# Patient Record
Sex: Female | Born: 1985 | Race: White | Hispanic: No | Marital: Single | Smoking: Current every day smoker
Health system: Southern US, Community
[De-identification: ages and names within clinical notes are randomized; demographics above are authoritative.]

## PROBLEM LIST (undated history)

## (undated) ENCOUNTER — Inpatient Hospital Stay (HOSPITAL_COMMUNITY): Payer: Self-pay

## (undated) DIAGNOSIS — B999 Unspecified infectious disease: Secondary | ICD-10-CM

## (undated) DIAGNOSIS — K56609 Unspecified intestinal obstruction, unspecified as to partial versus complete obstruction: Secondary | ICD-10-CM

## (undated) DIAGNOSIS — B182 Chronic viral hepatitis C: Secondary | ICD-10-CM

## (undated) DIAGNOSIS — A599 Trichomoniasis, unspecified: Secondary | ICD-10-CM

## (undated) DIAGNOSIS — K599 Functional intestinal disorder, unspecified: Secondary | ICD-10-CM

## (undated) DIAGNOSIS — N390 Urinary tract infection, site not specified: Secondary | ICD-10-CM

## (undated) DIAGNOSIS — N83209 Unspecified ovarian cyst, unspecified side: Secondary | ICD-10-CM

## (undated) HISTORY — PX: NO PAST SURGERIES: SHX2092

## (undated) HISTORY — PX: WISDOM TOOTH EXTRACTION: SHX21

## (undated) HISTORY — DX: Chronic viral hepatitis C: B18.2

---

## 2016-04-14 NOTE — L&D Delivery Note (Signed)
Patient is 31 y.o. E3P2951G5P3103 4455w3d admitted for active labor.   Delivery Note At [phone] a viable girl was delivered via  SVD, Presentation: cephalic,LOA. APGAR: 9,9 ; weight pending.   Placenta status: spontaneous, intact. Cord: 3 vessel  Anesthesia:  epidural Episiotomy:  none Lacerations:  none Suture Repair: none Est. Blood Loss (mL): 250  Mom to postpartum.  Baby to Couplet care / Skin to Skin.  Rolm BookbinderAmber Margareta Laureano, DO [facility]

## 2016-12-09 ENCOUNTER — Ambulatory Visit (INDEPENDENT_AMBULATORY_CARE_PROVIDER_SITE_OTHER): Payer: Self-pay | Admitting: Certified Nurse Midwife

## 2016-12-09 ENCOUNTER — Encounter: Payer: Self-pay | Admitting: Family Medicine

## 2016-12-09 DIAGNOSIS — Z3201 Encounter for pregnancy test, result positive: Secondary | ICD-10-CM

## 2016-12-09 LAB — POCT PREGNANCY, URINE: Preg Test, Ur: POSITIVE — AB

## 2016-12-09 NOTE — Progress Notes (Signed)
Reviewed nurses note. Agree with plan. 

## 2016-12-09 NOTE — Progress Notes (Signed)
Patient presented to the office today for a pregnancy test. Test confirms she is pregnant around 22 weeks. Patient has been provided a verification letter of pregnancy to apply for assistance. Patient reports only taking prenatal vitamins at this time. She will follow up with her ob provider.

## 2016-12-23 ENCOUNTER — Encounter: Payer: Self-pay | Admitting: Obstetrics and Gynecology

## 2016-12-23 ENCOUNTER — Ambulatory Visit (INDEPENDENT_AMBULATORY_CARE_PROVIDER_SITE_OTHER): Payer: Self-pay | Admitting: Obstetrics and Gynecology

## 2016-12-23 VITALS — BP 98/54 | HR 69 | Ht 62.0 in | Wt 153.7 lb

## 2016-12-23 DIAGNOSIS — Z3480 Encounter for supervision of other normal pregnancy, unspecified trimester: Secondary | ICD-10-CM

## 2016-12-23 DIAGNOSIS — Z113 Encounter for screening for infections with a predominantly sexual mode of transmission: Secondary | ICD-10-CM

## 2016-12-23 DIAGNOSIS — O09212 Supervision of pregnancy with history of pre-term labor, second trimester: Secondary | ICD-10-CM

## 2016-12-23 DIAGNOSIS — Z3492 Encounter for supervision of normal pregnancy, unspecified, second trimester: Secondary | ICD-10-CM

## 2016-12-23 DIAGNOSIS — Z8751 Personal history of pre-term labor: Secondary | ICD-10-CM

## 2016-12-23 DIAGNOSIS — Z8759 Personal history of other complications of pregnancy, childbirth and the puerperium: Secondary | ICD-10-CM | POA: Insufficient documentation

## 2016-12-23 HISTORY — DX: Personal history of pre-term labor: Z87.51

## 2016-12-23 LAB — POCT URINALYSIS DIP (DEVICE)
BILIRUBIN URINE: NEGATIVE
GLUCOSE, UA: NEGATIVE mg/dL
Hgb urine dipstick: NEGATIVE
KETONES UR: NEGATIVE mg/dL
LEUKOCYTES UA: NEGATIVE
NITRITE: NEGATIVE
PH: 7 (ref 5.0–8.0)
PROTEIN: NEGATIVE mg/dL
SPECIFIC GRAVITY, URINE: 1.02 (ref 1.005–1.030)
UROBILINOGEN UA: 0.2 mg/dL (ref 0.0–1.0)

## 2016-12-23 MED ORDER — PRENATAL VITAMINS 28-0.8 MG PO TABS: 1.0000 | ORAL_TABLET | Freq: Every day | ORAL | 3 refills | Status: DC

## 2016-12-23 NOTE — Progress Notes (Signed)
Subjective:   Beth Cordova is a 31 y.o. G5P2013 at [redacted]w[redacted]d by LMP being seen today for her first obstetrical visit.  Her obstetrical history is significant for previous preterm delivery. Patient does intend to breast feed. Pregnancy history fully reviewed. Unplanned pregnancy.  FOB not involved at this time.  Previous fetal loss @ 20 weeks; spontaneous rupture of membranes; patient delivered at home in the bathtub. Prior pregnancies term with no complications. Currently [facility] at [facility].   Patient reports no complaints.  HISTORY: Obstetric History   G5   P3   T2   P0   A1   L3    SAB0   TAB0   Ectopic0   Multiple0   Live Births3     # Outcome Date GA Lbr Len/2nd Weight Sex Delivery Anes PTL Lv  5 Gravida           4 AB 04/09/15 [redacted]w[redacted]d  8 oz (0.227 kg) F   Y ND  3 Term 04/24/12 [redacted]w[redacted]d  5 lb 13 oz (2.637 kg) F Vag-Spont EPI  LIV  2 Para 04/01/05 [redacted]w[redacted]d  6 lb 12 oz (3.062 kg) F Vag-Spont EPI N LIV  1 Term 04/08/04 [redacted]w[redacted]d  6 lb 5 oz (2.863 kg) F Vag-Spont EPI N LIV     History reviewed. No pertinent past medical history. History reviewed. No pertinent surgical history. History reviewed. No pertinent family history. Social History  Substance Use Topics  . Smoking status: Never Smoker  . Smokeless tobacco: Never Used  . Alcohol use No   No Known Allergies No current outpatient prescriptions on file prior to visit.   No current facility-administered medications on file prior to visit.      Exam   Vitals:   12/23/16 1334 12/23/16 1343  BP: (!) 98/54   Pulse: 69   Weight: 153 lb 11.2 oz (69.7 kg)   Height:   (1.575 m)   Fetal Heart Rate (bpm): 148  Uterus:  Fundal Height: 24 cm  System: General: well-developed, well-nourished female in no acute distress   Breast:  normal appearance, no masses or tenderness   Skin: normal coloration and turgor, no rashes   Neurologic: oriented, normal, negative, normal mood   Extremities: normal strength, tone, and muscle  mass, ROM of all joints is normal   HEENT PERRLA, extraocular movement intact and sclera clear, anicteric   Mouth/Teeth mucous membranes moist, pharynx normal without lesions and dental hygiene good   Neck supple and no masses   Cardiovascular: regular rate and rhythm   Respiratory:  no respiratory distress, normal breath sounds   Abdomen: soft, non-tender; bowel sounds normal; no masses,  no organomegaly     Assessment:   Pregnancy: [id number] Patient Active Problem List   Diagnosis Date Noted  . Supervision of other normal pregnancy, antepartum 12/23/2016  . History of preterm delivery 12/23/2016     Plan:   1. Encounter for supervision of low-risk pregnancy in second trimester  - GC/Chlamydia probe amp ([place])not at [facility] - Cystic Fibrosis Mutation 97 - Culture, OB Urine - Obstetric Panel, Including HIV - Flu Vaccine QUAD 36+ mos IM - [place] MFM OB DETAIL +14 WK; Future  2. Supervision of low-risk pregnancy, second trimester   3. History of preterm delivery    Initial labs drawn. Continue prenatal vitamins. Genetic Screening discussed, Quad screen: requested, however patient is self pay.  Will hold off from ordering at this time. Medicaid is pending.  Ultrasound discussed; fetal anatomic survey: requested. Problem list reviewed and updated. The nature of [facility] with multiple MDs and other Advanced Practice Providers was explained to patient; also emphasized that residents, students are part of our team. Routine obstetric precautions reviewed. Discussed history with Dr. Debroah LoopArnold.    Melayna Robarts, Harolyn RutherfordJennifer I, NP [facility] for [facility], [facility]

## 2016-12-24 ENCOUNTER — Encounter: Payer: Self-pay | Admitting: Family Medicine

## 2016-12-24 LAB — OBSTETRIC PANEL, INCLUDING HIV
Antibody Screen: NEGATIVE
BASOS: 0 %
Basophils Absolute: 0 10*3/uL (ref 0.0–0.2)
EOS (ABSOLUTE): 0.1 10*3/uL (ref 0.0–0.4)
EOS: 2 %
HEMATOCRIT: 33.1 % — AB (ref 34.0–46.6)
HEMOGLOBIN: 10.9 g/dL — AB (ref 11.1–15.9)
HEP B S AG: NEGATIVE
HIV Screen 4th Generation wRfx: NONREACTIVE
IMMATURE GRANS (ABS): 0 10*3/uL (ref 0.0–0.1)
IMMATURE GRANULOCYTES: 0 %
LYMPHS: 34 %
Lymphocytes Absolute: 2.4 10*3/uL (ref 0.7–3.1)
MCH: 31.7 pg (ref 26.6–33.0)
MCHC: 32.9 g/dL (ref 31.5–35.7)
MCV: 96 fL (ref 79–97)
Monocytes Absolute: 0.3 10*3/uL (ref 0.1–0.9)
Monocytes: 5 %
Neutrophils Absolute: 4.3 10*3/uL (ref 1.4–7.0)
Neutrophils: 59 %
Platelets: 232 10*3/uL (ref 150–379)
RBC: 3.44 x10E6/uL — ABNORMAL LOW (ref 3.77–5.28)
RDW: 13.1 % (ref 12.3–15.4)
RH TYPE: POSITIVE
RPR: NONREACTIVE
Rubella Antibodies, IGG: 3.06 index (ref >0.99)
WBC: 7.2 10*3/uL (ref 3.4–10.8)

## 2016-12-25 ENCOUNTER — Encounter: Payer: Self-pay | Admitting: Family Medicine

## 2016-12-25 LAB — URINE CULTURE, OB REFLEX

## 2016-12-25 LAB — CULTURE, OB URINE

## 2016-12-26 ENCOUNTER — Encounter (HOSPITAL_COMMUNITY): Payer: Self-pay

## 2016-12-29 LAB — CYSTIC FIBROSIS MUTATION 97: GENE DIS ANAL CARRIER INTERP BLD/T-IMP: NOT DETECTED

## 2016-12-30 ENCOUNTER — Encounter (HOSPITAL_COMMUNITY): Payer: Self-pay

## 2016-12-30 ENCOUNTER — Ambulatory Visit (HOSPITAL_COMMUNITY)
Admission: RE | Admit: 2016-12-30 | Discharge: 2016-12-30 | Disposition: A | Discharge status: Home / Self Care | Payer: Medicaid Other | Source: Ambulatory Visit | Attending: Obstetrics and Gynecology | Admitting: Obstetrics and Gynecology

## 2016-12-30 DIAGNOSIS — Z363 Encounter for antenatal screening for malformations: Secondary | ICD-10-CM | POA: Diagnosis present

## 2016-12-30 DIAGNOSIS — O09292 Supervision of pregnancy with other poor reproductive or obstetric history, second trimester: Secondary | ICD-10-CM | POA: Diagnosis present

## 2016-12-30 DIAGNOSIS — Z3A25 25 weeks gestation of pregnancy: Secondary | ICD-10-CM | POA: Diagnosis not present

## 2016-12-30 DIAGNOSIS — Z3492 Encounter for supervision of normal pregnancy, unspecified, second trimester: Secondary | ICD-10-CM

## 2016-12-30 DIAGNOSIS — O0932 Supervision of pregnancy with insufficient antenatal care, second trimester: Secondary | ICD-10-CM | POA: Insufficient documentation

## 2016-12-30 DIAGNOSIS — O321XX0 Maternal care for breech presentation, not applicable or unspecified: Secondary | ICD-10-CM | POA: Diagnosis not present

## 2016-12-30 HISTORY — DX: Trichomoniasis, unspecified: A59.9

## 2016-12-30 HISTORY — DX: Functional intestinal disorder, unspecified: K59.9

## 2016-12-30 NOTE — Progress Notes (Signed)
Patient was not in lobby when called. RN was informed by registration that patient went into U/S with another patient that is here.

## 2016-12-31 ENCOUNTER — Other Ambulatory Visit (HOSPITAL_COMMUNITY): Payer: Self-pay | Admitting: *Deleted

## 2016-12-31 DIAGNOSIS — IMO0002 Reserved for concepts with insufficient information to code with codable children: Secondary | ICD-10-CM

## 2016-12-31 DIAGNOSIS — Z0489 Encounter for examination and observation for other specified reasons: Secondary | ICD-10-CM

## 2017-01-20 ENCOUNTER — Ambulatory Visit (INDEPENDENT_AMBULATORY_CARE_PROVIDER_SITE_OTHER): Payer: Self-pay | Admitting: Family Medicine

## 2017-01-20 VITALS — BP 133/76 | HR 80 | Wt 160.1 lb

## 2017-01-20 DIAGNOSIS — Z3483 Encounter for supervision of other normal pregnancy, third trimester: Secondary | ICD-10-CM

## 2017-01-20 DIAGNOSIS — Z23 Encounter for immunization: Secondary | ICD-10-CM

## 2017-01-20 DIAGNOSIS — Z3480 Encounter for supervision of other normal pregnancy, unspecified trimester: Secondary | ICD-10-CM

## 2017-01-20 NOTE — Patient Instructions (Signed)

## 2017-01-20 NOTE — Progress Notes (Signed)
   PRENATAL VISIT NOTE  Subjective:  Beth Cordova is a 31 y.o. G5P2013 at [redacted]w[redacted]d being seen today for ongoing prenatal care.  She is currently monitored for the following issues for this low-risk pregnancy and has Supervision of other normal pregnancy, antepartum and History of preterm delivery on her problem list.  Patient reports no complaints.  Contractions: Not present. Vag. Bleeding: None.  Movement: Present. Denies leaking of fluid.   The following portions of the patient's history were reviewed and updated as appropriate: allergies, current medications, past family history, past medical history, past social history, past surgical history and problem list. Problem list updated.  Objective:   Vitals:   01/20/17 0843  BP: 133/76  Pulse: 80  Weight: 160 lb 1.6 oz (72.6 kg)    Fetal Status: Fetal Heart Rate (bpm): 130   Movement: Present   Fundal height 29 cm  General:  Alert, oriented and cooperative. Patient is in no acute distress.  Skin: Skin is warm and dry. No rash noted.   Cardiovascular: Normal heart rate noted  Respiratory: Normal respiratory effort, no problems with respiration noted  Abdomen: Soft, gravid, appropriate for gestational age.  Pain/Pressure: Absent     Pelvic: Cervical exam deferred        Extremities: Normal range of motion.  Edema: None  Mental Status:  Normal mood and affect. Normal behavior. Normal judgment and thought content.   Assessment and Plan:  Pregnancy: G5P2013 at [redacted]w[redacted]d  1. Encounter for supervision of other normal pregnancy in third trimester - Glucose Tolerance, 2 Hours w/1 Hour - CBC - HIV antibody (with reflex) - RPR - Tdap vaccine greater than or equal to 7yo IM -declines flu - needs interval growth [place] at 31 weeks  2. Need for diphtheria-tetanus-pertussis (Tdap) vaccine - Tdap vaccine greater than or equal to 7yo IM   Preterm labor symptoms and general obstetric precautions including but not limited to vaginal bleeding,  contractions, leaking of fluid and fetal movement were reviewed in detail with the patient. Please refer to After Visit Summary for other counseling recommendations.  Return in about 2 weeks (around 02/03/2017).   Rolm Bookbinder, DO

## 2017-01-20 NOTE — Progress Notes (Signed)
28 week labs/tdap today 

## 2017-01-21 LAB — CBC
HEMATOCRIT: 33.3 % — AB (ref 34.0–46.6)
HEMOGLOBIN: 11.1 g/dL (ref 11.1–15.9)
MCH: 32 pg (ref 26.6–33.0)
MCHC: 33.3 g/dL (ref 31.5–35.7)
MCV: 96 fL (ref 79–97)
Platelets: 221 10*3/uL (ref 150–379)
RBC: 3.47 x10E6/uL — ABNORMAL LOW (ref 3.77–5.28)
RDW: 13.2 % (ref 12.3–15.4)
WBC: 7.1 10*3/uL (ref 3.4–10.8)

## 2017-01-21 LAB — HIV ANTIBODY (ROUTINE TESTING W REFLEX): HIV SCREEN 4TH GENERATION: NONREACTIVE

## 2017-01-21 LAB — RPR: RPR Ser Ql: NONREACTIVE

## 2017-01-21 LAB — GLUCOSE TOLERANCE, 2 HOURS W/ 1HR
GLUCOSE, 1 HOUR: 112 mg/dL (ref 65–179)
Glucose, 2 hour: 54 mg/dL — ABNORMAL LOW (ref 65–152)
Glucose, Fasting: 78 mg/dL (ref 65–91)

## 2017-01-23 ENCOUNTER — Encounter (HOSPITAL_COMMUNITY): Payer: Self-pay

## 2017-01-27 ENCOUNTER — Ambulatory Visit (HOSPITAL_COMMUNITY)
Admission: RE | Admit: 2017-01-27 | Discharge: 2017-01-27 | Disposition: A | Discharge status: Home / Self Care | Payer: Medicaid Other | Source: Ambulatory Visit | Attending: Obstetrics and Gynecology | Admitting: Obstetrics and Gynecology

## 2017-01-27 ENCOUNTER — Other Ambulatory Visit (HOSPITAL_COMMUNITY): Payer: Self-pay | Admitting: Obstetrics and Gynecology

## 2017-01-27 ENCOUNTER — Encounter (HOSPITAL_COMMUNITY): Payer: Self-pay

## 2017-01-27 DIAGNOSIS — Z3A29 29 weeks gestation of pregnancy: Secondary | ICD-10-CM

## 2017-01-27 DIAGNOSIS — IMO0002 Reserved for concepts with insufficient information to code with codable children: Secondary | ICD-10-CM

## 2017-01-27 DIAGNOSIS — Z0489 Encounter for examination and observation for other specified reasons: Secondary | ICD-10-CM

## 2017-01-27 DIAGNOSIS — O09212 Supervision of pregnancy with history of pre-term labor, second trimester: Secondary | ICD-10-CM | POA: Diagnosis not present

## 2017-01-27 DIAGNOSIS — Z362 Encounter for other antenatal screening follow-up: Secondary | ICD-10-CM | POA: Diagnosis not present

## 2017-01-28 ENCOUNTER — Other Ambulatory Visit (HOSPITAL_COMMUNITY): Payer: Self-pay | Admitting: *Deleted

## 2017-01-28 DIAGNOSIS — Z0489 Encounter for examination and observation for other specified reasons: Secondary | ICD-10-CM

## 2017-01-28 DIAGNOSIS — IMO0002 Reserved for concepts with insufficient information to code with codable children: Secondary | ICD-10-CM

## 2017-02-03 ENCOUNTER — Ambulatory Visit (INDEPENDENT_AMBULATORY_CARE_PROVIDER_SITE_OTHER): Payer: Medicaid Other | Admitting: Medical

## 2017-02-03 VITALS — BP 114/66 | HR 78 | Wt 162.4 lb

## 2017-02-03 DIAGNOSIS — Z8751 Personal history of pre-term labor: Secondary | ICD-10-CM

## 2017-02-03 DIAGNOSIS — O09213 Supervision of pregnancy with history of pre-term labor, third trimester: Secondary | ICD-10-CM

## 2017-02-03 DIAGNOSIS — Z3480 Encounter for supervision of other normal pregnancy, unspecified trimester: Secondary | ICD-10-CM

## 2017-02-03 DIAGNOSIS — Z3483 Encounter for supervision of other normal pregnancy, third trimester: Secondary | ICD-10-CM

## 2017-02-03 NOTE — Progress Notes (Signed)
   PRENATAL VISIT NOTE  Subjective:  Beth Cordova is a 31 y.o. G5P2013 at 3916w1d being seen today for ongoing prenatal care.  She is currently monitored for the following issues for this high-risk pregnancy and has Supervision of other normal pregnancy, antepartum and History of preterm delivery on her problem list.  Patient reports no complaints.  Contractions: Not present. Vag. Bleeding: None.  Movement: Present. Denies leaking of fluid.   The following portions of the patient's history were reviewed and updated as appropriate: allergies, current medications, past family history, past medical history, past social history, past surgical history and problem list. Problem list updated.  Objective:   Vitals:   02/03/17 0940  BP: 114/66  Pulse: 78  Weight: 162 lb 6.4 oz (73.7 kg)    Fetal Status: Fetal Heart Rate (bpm): 145 Fundal Height: 30 cm Movement: Present     General:  Alert, oriented and cooperative. Patient is in no acute distress.  Skin: Skin is warm and dry. No rash noted.   Cardiovascular: Normal heart rate noted  Respiratory: Normal respiratory effort, no problems with respiration noted  Abdomen: Soft, gravid, appropriate for gestational age.  Pain/Pressure: Present     Pelvic: Cervical exam deferred        Extremities: Normal range of motion.  Edema: Trace  Mental Status:  Normal mood and affect. Normal behavior. Normal judgment and thought content.   Assessment and Plan:  Pregnancy: G5P2013 at 2016w1d  1. Supervision of other normal pregnancy, antepartum - Doing well - Follow-up anatomy 02/24/17  2. History of preterm delivery - Preterm labor precautions discussed - No recent S/S of PTL  Preterm labor symptoms and general obstetric precautions including but not limited to vaginal bleeding, contractions, leaking of fluid and fetal movement were reviewed in detail with the patient. Please refer to After Visit Summary for other counseling recommendations.  Return  [facility] about 2 weeks (around 02/17/2017[facility]   Vonzella NippleJulie Wenzel, PA-C

## 2017-02-03 NOTE — Patient Instructions (Signed)
Fetal Movement Counts °Patient Name: ________________________________________________ Patient Due Date: ____________________ °What is a fetal movement count? °A fetal movement count is the number of times that you feel your baby move during a certain amount of time. This may also be called a fetal kick count. A fetal movement count is recommended for every pregnant woman. You may be asked to start counting fetal movements as early as week 28 of your pregnancy. °Pay attention to when your baby is most active. You may notice your baby's sleep and wake cycles. You may also notice things that make your baby move more. You should do a fetal movement count: °· When your baby is normally most active. °· At the same time each day. ° °A good time to count movements is while you are resting, after having something to eat and drink. °How do I count fetal movements? °1. Find a quiet, comfortable area. Sit, or lie down on your side. °2. Write down the date, the start time and stop time, and the number of movements that you felt between those two times. Take this information with you to your health care visits. °3. For 2 hours, count kicks, flutters, swishes, rolls, and jabs. You should feel at least 10 movements during 2 hours. °4. You may stop counting after you have felt 10 movements. °5. If you do not feel 10 movements in 2 hours, have something to eat and drink. Then, keep resting and counting for 1 hour. If you feel at least 4 movements during that hour, you may stop counting. °Contact a health care provider if: °· You feel fewer than 4 movements in 2 hours. °· Your baby is not moving like he or she usually does. °Date: ____________ Start time: ____________ Stop time: ____________ Movements: ____________ °Date: ____________ Start time: ____________ Stop time: ____________ Movements: ____________ °Date: ____________ Start time: ____________ Stop time: ____________ Movements: ____________ °Date: ____________ Start time:  ____________ Stop time: ____________ Movements: ____________ °Date: ____________ Start time: ____________ Stop time: ____________ Movements: ____________ °Date: ____________ Start time: ____________ Stop time: ____________ Movements: ____________ °Date: ____________ Start time: ____________ Stop time: ____________ Movements: ____________ °Date: ____________ Start time: ____________ Stop time: ____________ Movements: ____________ °Date: ____________ Start time: ____________ Stop time: ____________ Movements: ____________ °This information is not intended to replace advice given to you by your health care provider. Make sure you discuss any questions you have with your health care provider. °Document Released: 04/30/2006 Document Revised: 11/28/2015 Document Reviewed: 05/10/2015 °Elsevier Interactive Patient Education © 2018 Elsevier Inc[employer] °Braxton Hicks Contractions °Contractions of the uterus can occur throughout pregnancy, but they are not always a sign that you are in labor. You may have practice contractions called Braxton Hicks contractions. These false labor contractions are sometimes confused with true labor. °What are Braxton Hicks contractions? °Braxton Hicks contractions are tightening movements that occur in the muscles of the uterus before labor. Unlike true labor contractions, these contractions do not result in opening (dilation) and thinning of the cervix. Toward the end of pregnancy (32-34 weeks), Braxton Hicks contractions can happen more often and may become stronger. These contractions are sometimes difficult to tell apart from true labor because they can be very uncomfortable. You should not feel embarrassed if you go to the hospital with false labor. °Sometimes, the only way to tell if you are in true labor is for your health care provider to look for changes in the cervix. The health care provider will do a physical exam and may monitor your contractions. If   you are not in true labor, the exam  should show that your cervix is not dilating and your water has not broken. °If there are no prenatal problems or other health problems associated with your pregnancy, it is completely safe for you to be sent home with false labor. You may continue to have Braxton Hicks contractions until you go into true labor. °How can I tell the difference between true labor and false labor? °· Differences °? False labor °? Contractions last 30-70 seconds.: Contractions are usually shorter and not as strong as true labor contractions. °? Contractions become very regular.: Contractions are usually irregular. °? Discomfort is usually felt in the top of the uterus, and it spreads to the lower abdomen and low back.: Contractions are often felt in the front of the lower abdomen and in the groin. °? Contractions do not go away with walking.: Contractions may go away when you walk around or change positions while lying down. °? Contractions usually become more intense and increase in frequency.: Contractions get weaker and are shorter-lasting as time goes on. °? The cervix dilates and gets thinner.: The cervix usually does not dilate or become thin. °Follow these instructions at home: °· Take over-the-counter and prescription medicines only as told by your health care provider. °· Keep up with your usual exercises and follow other instructions from your health care provider. °· Eat and drink lightly if you think you are going into labor. °· If Braxton Hicks contractions are making you uncomfortable: °? Change your position from lying down or resting to walking, or change from walking to resting. °? Sit and rest in a tub of warm water. °? Drink enough fluid to keep your urine clear or pale yellow. Dehydration may cause these contractions. °? Do slow and deep breathing several times an hour. °· Keep all follow-up prenatal visits as told by your health care provider. This is important. °Contact a health care provider if: °· You have a  fever. °· You have continuous pain in your abdomen. °Get help right away if: °· Your contractions become stronger, more regular, and closer together. °· You have fluid leaking or gushing from your vagina. °· You pass blood-tinged mucus (bloody show). °· You have bleeding from your vagina. °· You have low back pain that you never had before. °· You feel your baby’s head pushing down and causing pelvic pressure. °· Your baby is not moving inside you as much as it used to. °Summary °· Contractions that occur before labor are called Braxton Hicks contractions, false labor, or practice contractions. °· Braxton Hicks contractions are usually shorter, weaker, farther apart, and less regular than true labor contractions. True labor contractions usually become progressively stronger and regular and they become more frequent. °· Manage discomfort from Braxton Hicks contractions by changing position, resting in a warm bath, drinking plenty of water, or practicing deep breathing. °This information is not intended to replace advice given to you by your health care provider. Make sure you discuss any questions you have with your health care provider. °Document Released: 03/31/2005 Document Revised: 02/18/2016 Document Reviewed: 02/18/2016 °Elsevier Interactive Patient Education © 2017 Elsevier Inc. ° °

## 2017-02-16 ENCOUNTER — Inpatient Hospital Stay (HOSPITAL_COMMUNITY)
Admission: AD | Admit: 2017-02-16 | Discharge: 2017-02-16 | Disposition: A | Discharge status: Home / Self Care | Payer: Medicaid Other | Source: Ambulatory Visit | Attending: Obstetrics & Gynecology | Admitting: Obstetrics & Gynecology

## 2017-02-16 ENCOUNTER — Encounter (HOSPITAL_COMMUNITY): Payer: Self-pay | Admitting: *Deleted

## 2017-02-16 DIAGNOSIS — S00522A Blister (nonthermal) of oral cavity, initial encounter: Secondary | ICD-10-CM

## 2017-02-16 DIAGNOSIS — K047 Periapical abscess without sinus: Secondary | ICD-10-CM | POA: Insufficient documentation

## 2017-02-16 DIAGNOSIS — Z87891 Personal history of nicotine dependence: Secondary | ICD-10-CM | POA: Diagnosis not present

## 2017-02-16 DIAGNOSIS — O9989 Other specified diseases and conditions complicating pregnancy, childbirth and the puerperium: Secondary | ICD-10-CM

## 2017-02-16 DIAGNOSIS — O26893 Other specified pregnancy related conditions, third trimester: Secondary | ICD-10-CM | POA: Diagnosis present

## 2017-02-16 DIAGNOSIS — Z3A32 32 weeks gestation of pregnancy: Secondary | ICD-10-CM | POA: Diagnosis not present

## 2017-02-16 DIAGNOSIS — K0510 Chronic gingivitis, plaque induced: Secondary | ICD-10-CM | POA: Diagnosis not present

## 2017-02-16 HISTORY — DX: Unspecified intestinal obstruction, unspecified as to partial versus complete obstruction: K56.609

## 2017-02-16 LAB — URINALYSIS, ROUTINE W REFLEX MICROSCOPIC
BILIRUBIN URINE: NEGATIVE
GLUCOSE, UA: NEGATIVE mg/dL
HGB URINE DIPSTICK: NEGATIVE
KETONES UR: NEGATIVE mg/dL
NITRITE: NEGATIVE
PH: 5 (ref 5.0–8.0)
Protein, ur: 30 mg/dL — AB
Specific Gravity, Urine: 1.023 (ref 1.005–1.030)

## 2017-02-16 MED ORDER — AMOXICILLIN 500 MG PO CAPS: 500.0000 mg | ORAL_CAPSULE | Freq: Two times a day (BID) | ORAL | 0 refills | Status: AC

## 2017-02-16 NOTE — MAU Note (Signed)
Pt presents to MAU with complaints of gum soreness for 3 days. Was told to come here by her dentist since she is pregnant

## 2017-02-16 NOTE — MAU Provider Note (Signed)
  History     CSN: [id number]  Arrival date and time: 02/16/17 0900   None     Chief Complaint  Patient presents with  . gum pain   31 yo G5P2013 at 4636w0d here for right upper gum pain and swelling x 3-4 days. Denies fever, sweats, chills. Admits to associated right facial pain. Called her dentist but they said they would not see her while pregnant.    OB History    Gravida Para Term Preterm AB Living   5 3 2   1 3    SAB TAB Ectopic Multiple Live Births   0       3      Past Medical History:  Diagnosis Date  . Bowel dysfunction    States she has to eat 5 small meals a day  . Bowel obstruction (HCC)   . Trichomonas infection     Past Surgical History:  Procedure Laterality Date  . WISDOM TOOTH EXTRACTION      No family history on file.  Social History   Tobacco Use  . Smoking status: Former Games developermoker  . Smokeless tobacco: Never Used  Substance Use Topics  . Alcohol use: No  . Drug use: No    Allergies: No Known Allergies  Medications Prior to Admission  Medication Sig Dispense Refill Last Dose  . Prenatal Vit-Fe Fumarate-FA (PRENATAL VITAMINS) 28-0.8 MG TABS Take 1 tablet by mouth daily. 60 tablet 3 Taking    Review of Systems  Constitutional: Negative for fatigue and fever.  HENT: Positive for dental problem. Negative for congestion.   Eyes: Negative for discharge and itching.  Respiratory: Negative for apnea and chest tightness.   Cardiovascular: Negative for chest pain and leg swelling.  Gastrointestinal: Negative for abdominal distention and abdominal pain.  Endocrine: Negative for cold intolerance and heat intolerance.  Genitourinary: Negative for difficulty urinating and dyspareunia.  Musculoskeletal: Negative for arthralgias and back pain.  Psychiatric/Behavioral: Negative for agitation and behavioral problems.   Physical Exam   Blood pressure 116/78, pulse 94, temperature (!) 97.4 F (36.3 C), resp. rate 16, weight 165 lb (74.8 kg), last  menstrual period 07/07/2016, unknown if currently breastfeeding.  Physical Exam  Constitutional: She is oriented to person, place, and time. She appears well-developed and well-nourished.  HENT:  Head: Normocephalic and atraumatic.  Right upper gum with 0.5 cm swelling and white exudate, tender to palpation. Right maxilla tender to palpation. No lymphadenopathy  Eyes: Conjunctivae are normal. Pupils are equal, round, and reactive to light.  Cardiovascular: Normal rate and intact distal pulses.  Respiratory: Effort normal. No respiratory distress.  Musculoskeletal: Normal range of motion. She exhibits no edema.  Neurological: She is alert and oriented to person, place, and time.  Skin: Skin is warm and dry.  Psychiatric: She has a normal mood and affect. Her behavior is normal.    MAU Course  Procedures   Assessment and Plan  1. Pregnancy at [redacted] weeks gestation- follow up outpatient 2. Gum/tooth infection- treat with amoxicillan. Follow up outpatient with different dentist. Tylenol for pain  Braycen Burandt 02/16/2017, 12:32 PM

## 2017-02-18 ENCOUNTER — Ambulatory Visit (INDEPENDENT_AMBULATORY_CARE_PROVIDER_SITE_OTHER): Payer: Medicaid Other | Admitting: Obstetrics & Gynecology

## 2017-02-18 ENCOUNTER — Encounter: Payer: Self-pay | Admitting: Family Medicine

## 2017-02-18 VITALS — BP 105/66 | HR 67 | Wt 168.2 lb

## 2017-02-18 DIAGNOSIS — Z3480 Encounter for supervision of other normal pregnancy, unspecified trimester: Secondary | ICD-10-CM

## 2017-02-18 DIAGNOSIS — O09213 Supervision of pregnancy with history of pre-term labor, third trimester: Secondary | ICD-10-CM

## 2017-02-18 DIAGNOSIS — Z8751 Personal history of pre-term labor: Secondary | ICD-10-CM

## 2017-02-18 NOTE — Progress Notes (Signed)
   PRENATAL VISIT NOTE  Subjective:  Beth ShuttersSavanah Cordova is a 31 y.o. Z6X0960G5P3103 at 8253w2d being seen today for ongoing prenatal care.  She is currently monitored for the following issues for this high-risk pregnancy and has Supervision of other normal pregnancy, antepartum and History of preterm delivery on their problem list.  Patient reports no complaints.  Contractions: Not present. Vag. Bleeding: None.  Movement: Present. Denies leaking of fluid.   The following portions of the patient's history were reviewed and updated as appropriate: allergies, current medications, past family history, past medical history, past social history, past surgical history and problem list. Problem list updated.  Objective:   Vitals:   02/18/17 1508  BP: 105/66  Pulse: 67  Weight: 168 lb 3.2 oz (76.3 kg)    Fetal Status: Fetal Heart Rate (bpm): 132 Fundal Height: 30 cm Movement: Present     General:  Alert, oriented and cooperative. Patient is in no acute distress.  Skin: Skin is warm and dry. No rash noted.   Cardiovascular: Normal heart rate noted  Respiratory: Normal respiratory effort, no problems with respiration noted  Abdomen: Soft, gravid, appropriate for gestational age.  Pain/Pressure: Absent     Pelvic: Cervical exam deferred        Extremities: Normal range of motion.  Edema: None  Mental Status:  Normal mood and affect. Normal behavior. Normal judgment and thought content.   Assessment and Plan:  Pregnancy: A5W0981G5P3103 at 7153w2d  1. History of preterm delivery 2. Supervision of other normal pregnancy, antepartum Preterm labor symptoms and general obstetric precautions including but not limited to vaginal bleeding, contractions, leaking of fluid and fetal movement were reviewed in detail with the patient. Please refer to After Visit Summary for other counseling recommendations.  Return in about 2 weeks (around 03/04/2017) for OB Visit (LOB).   Jaynie CollinsUgonna Ingris Pasquarella, MD

## 2017-02-18 NOTE — Patient Instructions (Signed)
Return to clinic for any scheduled appointments or obstetric concerns, or go to MAU for evaluation  

## 2017-02-23 ENCOUNTER — Encounter (HOSPITAL_COMMUNITY): Payer: Self-pay

## 2017-02-24 ENCOUNTER — Encounter (HOSPITAL_COMMUNITY): Payer: Self-pay

## 2017-02-24 ENCOUNTER — Ambulatory Visit (HOSPITAL_COMMUNITY)
Admission: RE | Admit: 2017-02-24 | Discharge: 2017-02-24 | Disposition: A | Discharge status: Home / Self Care | Payer: Medicaid Other | Source: Ambulatory Visit | Attending: Obstetrics and Gynecology | Admitting: Obstetrics and Gynecology

## 2017-02-24 ENCOUNTER — Other Ambulatory Visit (HOSPITAL_COMMUNITY): Payer: Self-pay | Admitting: Obstetrics and Gynecology

## 2017-02-24 DIAGNOSIS — Z3A33 33 weeks gestation of pregnancy: Secondary | ICD-10-CM

## 2017-02-24 DIAGNOSIS — Z362 Encounter for other antenatal screening follow-up: Secondary | ICD-10-CM | POA: Insufficient documentation

## 2017-02-24 DIAGNOSIS — Z0489 Encounter for examination and observation for other specified reasons: Secondary | ICD-10-CM

## 2017-02-24 DIAGNOSIS — O09299 Supervision of pregnancy with other poor reproductive or obstetric history, unspecified trimester: Secondary | ICD-10-CM

## 2017-02-24 DIAGNOSIS — IMO0002 Reserved for concepts with insufficient information to code with codable children: Secondary | ICD-10-CM

## 2017-03-09 ENCOUNTER — Ambulatory Visit (INDEPENDENT_AMBULATORY_CARE_PROVIDER_SITE_OTHER): Payer: Medicaid Other | Admitting: Family Medicine

## 2017-03-09 ENCOUNTER — Other Ambulatory Visit (HOSPITAL_COMMUNITY)
Admission: RE | Admit: 2017-03-09 | Discharge: 2017-03-09 | Disposition: A | Discharge status: Home / Self Care | Payer: Medicaid Other | Source: Ambulatory Visit | Attending: Family Medicine | Admitting: Family Medicine

## 2017-03-09 VITALS — BP 114/70 | HR 84 | Wt 172.3 lb

## 2017-03-09 DIAGNOSIS — Z8751 Personal history of pre-term labor: Secondary | ICD-10-CM

## 2017-03-09 DIAGNOSIS — O98819 Other maternal infectious and parasitic diseases complicating pregnancy, unspecified trimester: Secondary | ICD-10-CM | POA: Diagnosis not present

## 2017-03-09 DIAGNOSIS — N760 Acute vaginitis: Secondary | ICD-10-CM | POA: Diagnosis not present

## 2017-03-09 DIAGNOSIS — A5909 Other urogenital trichomoniasis: Secondary | ICD-10-CM | POA: Diagnosis not present

## 2017-03-09 DIAGNOSIS — Z3A00 Weeks of gestation of pregnancy not specified: Secondary | ICD-10-CM | POA: Insufficient documentation

## 2017-03-09 DIAGNOSIS — Z3483 Encounter for supervision of other normal pregnancy, third trimester: Secondary | ICD-10-CM

## 2017-03-09 DIAGNOSIS — Z3480 Encounter for supervision of other normal pregnancy, unspecified trimester: Secondary | ICD-10-CM | POA: Diagnosis present

## 2017-03-09 DIAGNOSIS — B9689 Other specified bacterial agents as the cause of diseases classified elsewhere: Secondary | ICD-10-CM | POA: Insufficient documentation

## 2017-03-09 LAB — OB RESULTS CONSOLE GBS: STREP GROUP B AG: POSITIVE

## 2017-03-09 NOTE — Progress Notes (Signed)
   PRENATAL VISIT NOTE  Subjective:  Beth Cordova is a 31 y.o. [id number] at 362w0d being seen today for ongoing prenatal care.  She is currently monitored for the following issues for this low-risk pregnancy and has Supervision of other normal pregnancy, antepartum and History of preterm delivery on their problem list.  Patient reports contractions every 10 min on/off today..  Contractions: Irregular. Vag. Bleeding: None.  Movement: Present. Denies leaking of fluid.   The following portions of the patient's history were reviewed and updated as appropriate: allergies, current medications, past family history, past medical history, past social history, past surgical history and problem list. Problem list updated.  Objective:   Vitals:   03/09/17 1520  BP: 114/70  Pulse: 84  Weight: 172 lb 4.8 oz (78.2 kg)    Fetal Status: Fetal Heart Rate (bpm): 136 Fundal Height: 35 cm Movement: Present     General:  Alert, oriented and cooperative. Patient is in no acute distress.  Skin: Skin is warm and dry. No rash noted.   Cardiovascular: Normal heart rate noted  Respiratory: Normal respiratory effort, no problems with respiration noted  Abdomen: Soft, gravid, appropriate for gestational age.  Pain/Pressure: Present     Pelvic: Cervical exam performed Dilation: 1    /thick  Extremities: Normal range of motion.  Edema: Trace  Mental Status:  Normal mood and affect. Normal behavior. Normal judgment and thought content.   Assessment and Plan:  Pregnancy: [id number] at 4762w0d  1. Supervision of other normal pregnancy, antepartum - Follow up in 1 week - Cultures, GBS collected today - Having irregular ctx. Exam as above. Discussed PTL precautions and to go to MAU if regular contractions every 5 min of less.   2. History of preterm delivery - Discussed precautions as above  Preterm labor symptoms and general obstetric precautions including but not limited to vaginal bleeding, contractions, leaking  of fluid and fetal movement were reviewed in detail with the patient. Please refer to After Visit Summary for other counseling recommendations.  Return in about 1 week (around 03/16/2017) for LOB.  Beth FanningJulie P. Valin Massie, MD OB Fellow

## 2017-03-09 NOTE — Patient Instructions (Signed)
To sign up for prenatal classes go to [email or web address]

## 2017-03-10 LAB — CERVICOVAGINAL ANCILLARY ONLY
Bacterial vaginitis: NEGATIVE
CHLAMYDIA, DNA PROBE: NEGATIVE
Candida vaginitis: POSITIVE — AB
NEISSERIA GONORRHEA: NEGATIVE
TRICH (WINDOWPATH): POSITIVE — AB

## 2017-03-12 LAB — CULTURE, BETA STREP (GROUP B ONLY): STREP GP B CULTURE: POSITIVE — AB

## 2017-03-13 ENCOUNTER — Telehealth: Payer: Self-pay | Admitting: Family Medicine

## 2017-03-13 NOTE — Telephone Encounter (Signed)
Result from 03/09/17 shows trichomonas and candida vaginitis.  Attempted to call patient with results. No answer. Will attempt to call later. If no answer, will route to clinical pool to call.   Need pharmacy information to call  In Rx for Flagyl and terconazole. Ordered pending.   Raynelle FanningJulie P. Degele, MD OB Fellow

## 2017-03-16 ENCOUNTER — Ambulatory Visit (INDEPENDENT_AMBULATORY_CARE_PROVIDER_SITE_OTHER): Payer: Medicaid Other | Admitting: Advanced Practice Midwife

## 2017-03-16 VITALS — BP 119/66 | HR 89 | Wt 175.4 lb

## 2017-03-16 DIAGNOSIS — Z3480 Encounter for supervision of other normal pregnancy, unspecified trimester: Secondary | ICD-10-CM

## 2017-03-16 DIAGNOSIS — Z3483 Encounter for supervision of other normal pregnancy, third trimester: Secondary | ICD-10-CM

## 2017-03-16 MED ORDER — TERCONAZOLE 0.4 % VA CREA: 1.0000 | TOPICAL_CREAM | Freq: Every day | VAGINAL | 0 refills | Status: DC

## 2017-03-16 MED ORDER — METRONIDAZOLE 500 MG PO TABS: 2000.0000 mg | ORAL_TABLET | Freq: Once | ORAL | 0 refills | Status: AC

## 2017-03-16 NOTE — Progress Notes (Signed)
Educated pt on Rooming In  

## 2017-03-16 NOTE — Patient Instructions (Addendum)
AREA PEDIATRIC/FAMILY PRACTICE PHYSICIANS  [facility] [address], [address] [place], [place]  [address] Phone - [phone]   Fax - [phone]  [facility] [address] [place], [place] [address] Phone - [phone]   Fax - [phone]  [facility] [address] [place], [place]  [address] Phone - [phone]   Fax - [phone]  [facility] [address] [place], [place]  [address] Phone - [phone]   Fax - [phone]  [facility] [address] [place], [place]  [address] Phone - [phone]   Fax - [phone]  [facility] [address] [place], [place]  [address] Phone - [phone]   Fax - [phone]  [facility] [address] [place], [place]  [address] Phone - [phone]   Fax - [phone]  [facility] [address] [place], [place]  [address] Phone - [phone]   Fax - [phone]  [facility] [address] [place], [place]  [address] Phone - [phone]   Fax - [phone] [facility] [address] [place], [place]  [address] Phone - [phone]   Fax - [phone]  [facility] [address] [place], [place]  [address] Phone - [phone]   Fax - [phone]  [facility] [address] [place], [place]  [address] Phone - [phone]   Fax - [phone]  [facility] [address] [place], [place]  [address] Phone - [phone]   Fax - [phone]  [facility] [address] [place], [place]  [address] Phone - [phone]  [facility] [address] [place], [place]  [address] Phone - [phone]   Fax - [phone]  [facility] [address] [place], [place]  [address] Phone - [phone]   Fax - [phone]  [facility] [address] [place], [place]  [address] Phone - [phone]   Fax - [phone]  [facility] [address] [place], [place]  [address] Phone - [phone]   Fax - [phone]   [facility] [address] [place], [place]  [address] Phone - [phone]   Fax - [phone]  [facility] [address] [place], [place]  [address] Phone - [phone]   Fax - [phone]  [facility] [address] [place], [place]  [address] Phone - [phone]   Fax - [phone]  [facility] [address] , [place]  [address] Phone - [phone]   Fax - [phone]  [facility] [address] , [place]  [address] Phone - [phone]   Fax - [phone] [facility] [address] [place], [place]  [address] Phone - [phone]   Fax - [phone]  [facility] [address] [place], [place]  [address] Phone - [phone]   Fax - [phone]  [facility] [address] [place],   [address] Phone - [phone]   Fax - [phone]  [facility] [place] Charlene Flemming MD [address] [place]  [address] Phone [phone]  Fax [phone]   

## 2017-03-16 NOTE — Progress Notes (Signed)
   PRENATAL VISIT NOTE  Subjective:  Beth Cordova is a 31 y.o. [id number] at 7773w0d being seen today for ongoing prenatal care.  She is currently monitored for the following issues for this low-risk pregnancy and has Supervision of other normal pregnancy, antepartum and History of preterm delivery on their problem list.  Patient reports backache.  Contractions: Not present. Vag. Bleeding: None.  Movement: Present. Denies leaking of fluid.   The following portions of the patient's history were reviewed and updated as appropriate: allergies, current medications, past family history, past medical history, past social history, past surgical history and problem list. Problem list updated.  Objective:   Vitals:   03/16/17 1336  BP: 119/66  Pulse: 89  Weight: 175 lb 6.4 oz (79.6 kg)    Fetal Status: Fetal Heart Rate (bpm): 166 Fundal Height: 36 cm Movement: Present     General:  Alert, oriented and cooperative. Patient is in no acute distress.  Skin: Skin is warm and dry. No rash noted.   Cardiovascular: Normal heart rate noted  Respiratory: Normal respiratory effort, no problems with respiration noted  Abdomen: Soft, gravid, appropriate for gestational age.  Pain/Pressure: Present     Pelvic: Cervical exam deferred        Extremities: Normal range of motion.  Edema: Trace  Mental Status:  Normal mood and affect. Normal behavior. Normal judgment and thought content.   Assessment and Plan:  Pregnancy: [id number] at 10373w0d  1. Supervision of other normal pregnancy, antepartum - +GBS - + yeast, terazol 7 as directed.  - +Trich, did not return calls. Patient has to have paper rx. She is living in a maternity home, and they will fill rx for her. She reports that she has not spoken to this partner since July. Encouraged her to consider notifying him so that he can get treatment.   Preterm labor symptoms and general obstetric precautions including but not limited to vaginal bleeding,  contractions, leaking of fluid and fetal movement were reviewed in detail with the patient. Please refer to After Visit Summary for other counseling recommendations.  Return in about 1 week (around 03/23/2017).   Thressa ShellerHeather Hogan, CNM

## 2017-03-17 ENCOUNTER — Inpatient Hospital Stay (HOSPITAL_COMMUNITY)
Admission: AD | Admit: 2017-03-17 | Discharge: 2017-03-17 | Disposition: A | Discharge status: Home / Self Care | Payer: Medicaid Other | Source: Ambulatory Visit | Attending: Family Medicine | Admitting: Family Medicine

## 2017-03-17 ENCOUNTER — Encounter (HOSPITAL_COMMUNITY): Payer: Self-pay | Admitting: *Deleted

## 2017-03-17 ENCOUNTER — Other Ambulatory Visit: Payer: Self-pay

## 2017-03-17 DIAGNOSIS — O4703 False labor before 37 completed weeks of gestation, third trimester: Secondary | ICD-10-CM | POA: Diagnosis not present

## 2017-03-17 DIAGNOSIS — O471 False labor at or after 37 completed weeks of gestation: Secondary | ICD-10-CM | POA: Diagnosis not present

## 2017-03-17 DIAGNOSIS — Z3A38 38 weeks gestation of pregnancy: Secondary | ICD-10-CM | POA: Insufficient documentation

## 2017-03-17 HISTORY — DX: Unspecified ovarian cyst, unspecified side: N83.209

## 2017-03-17 HISTORY — DX: Unspecified infectious disease: B99.9

## 2017-03-17 NOTE — MAU Note (Signed)
Urine sent to lab 

## 2017-03-17 NOTE — MAU Note (Signed)
Was cramping real heavy last night.  Started contracting at 0700.  Still cramping a lot. No bleeding or leaking. Is really nauseated.

## 2017-03-17 NOTE — Discharge Instructions (Signed)

## 2017-03-17 NOTE — Progress Notes (Signed)
I have communicated with Steward DroneVeronica Rogers CNM and reviewed vital signs:  Vitals:   03/17/17 0924  BP: 121/85  Pulse: 73  Resp: 18  Temp: 97.7 F (36.5 C)  SpO2: 99%    Vaginal exam:  Dilation: Closed Effacement (%): Thick Cervical Position: Posterior Station: Ballotable Exam by:: Lucine Bilski,   Also reviewed contraction pattern and that non-stress test is reactive.  It has been documented that patient is contracting every 2-5 minutes with no cervical change over 1 hours not indicating active labor.  Patient denies any other complaints.  Based on this report provider has given order for discharge.  A discharge order and diagnosis entered by a provider.   Labor discharge instructions reviewed with patient.        2

## 2017-03-23 ENCOUNTER — Telehealth: Payer: Self-pay | Admitting: *Deleted

## 2017-03-23 NOTE — Telephone Encounter (Signed)
Left voicemail message for patient regarding appt tomorrow morning.  Explained the office would be closed until 10 am and her appt had been r/s to 03/26/17 at 1400.  Encouraged patient to call our office after 10 am tomorrow if this appt isn't convenient and we will r/s.

## 2017-03-24 ENCOUNTER — Encounter: Payer: Medicaid Other | Admitting: Advanced Practice Midwife

## 2017-03-26 ENCOUNTER — Ambulatory Visit (INDEPENDENT_AMBULATORY_CARE_PROVIDER_SITE_OTHER): Payer: Medicaid Other | Admitting: Family Medicine

## 2017-03-26 VITALS — BP 122/77 | HR 82 | Wt 176.1 lb

## 2017-03-26 DIAGNOSIS — Z8751 Personal history of pre-term labor: Secondary | ICD-10-CM

## 2017-03-26 DIAGNOSIS — Z3480 Encounter for supervision of other normal pregnancy, unspecified trimester: Secondary | ICD-10-CM

## 2017-03-26 DIAGNOSIS — O99820 Streptococcus B carrier state complicating pregnancy: Secondary | ICD-10-CM

## 2017-03-26 NOTE — Progress Notes (Signed)
   PRENATAL VISIT NOTE  Subjective:  Beth Cordova is a 31 y.o. [id number] at 1030w3d being seen today for ongoing prenatal care.  She is currently monitored for the following issues for this low-risk pregnancy and has Supervision of other normal pregnancy, antepartum; History of preterm delivery; and Group B Streptococcus carrier, +RV culture, currently pregnant on their problem list.  Patient reports no complaints.  Contractions: Irritability. Vag. Bleeding: None.  Movement: Present. Denies leaking of fluid.   The following portions of the patient's history were reviewed and updated as appropriate: allergies, current medications, past family history, past medical history, past social history, past surgical history and problem list. Problem list updated.  Objective:   Vitals:   03/26/17 1422  BP: 122/77  Pulse: 82  Weight: 176 lb 1.6 oz (79.9 kg)    Fetal Status: Fetal Heart Rate (bpm): 133 Fundal Height: 36 cm Movement: Present  Presentation: Vertex  General:  Alert, oriented and cooperative. Patient is in no acute distress.  Skin: Skin is warm and dry. No rash noted.   Cardiovascular: Normal heart rate noted  Respiratory: Normal respiratory effort, no problems with respiration noted  Abdomen: Soft, gravid, appropriate for gestational age.  Pain/Pressure: Absent     Pelvic: Cervical exam deferred        Extremities: Normal range of motion.  Edema: None  Mental Status:  Normal mood and affect. Normal behavior. Normal judgment and thought content.   Assessment and Plan:  Pregnancy: [id number] at 3330w3d  1. History of preterm delivery At term now  2. Supervision of other normal pregnancy, antepartum Continue routine prenatal care.   3. Group B Streptococcus carrier, +RV culture, currently pregnant Will need treatment in labor  Term labor symptoms and general obstetric precautions including but not limited to vaginal bleeding, contractions, leaking of fluid and fetal movement were  reviewed in detail with the patient. Please refer to After Visit Summary for other counseling recommendations.  Return in 1 week (on 04/02/2017).   Reva Boresanya S Baldemar Dady, MD

## 2017-03-26 NOTE — Patient Instructions (Signed)
Breastfeeding Deciding to breastfeed is one of the best choices you can make for you and your baby. A change in hormones during pregnancy causes your breast tissue to grow and increases the number and size of your milk ducts. These hormones also allow proteins, sugars, and fats from your blood supply to make breast milk in your milk-producing glands. Hormones prevent breast milk from being released before your baby is born as well as prompt milk flow after birth. Once breastfeeding has begun, thoughts of your baby, as well as his or her sucking or crying, can stimulate the release of milk from your milk-producing glands. Benefits of breastfeeding For Your Baby  Your first milk (colostrum) helps your baby's digestive system function better.  There are antibodies in your milk that help your baby fight off infections.  Your baby has a lower incidence of asthma, allergies, and sudden infant death syndrome.  The nutrients in breast milk are better for your baby than infant formulas and are designed uniquely for your baby's needs.  Breast milk improves your baby's brain development.  Your baby is less likely to develop other conditions, such as childhood obesity, asthma, or type 2 diabetes mellitus.  For You  Breastfeeding helps to create a very special bond between you and your baby.  Breastfeeding is convenient. Breast milk is always available at the correct temperature and costs nothing.  Breastfeeding helps to burn calories and helps you lose the weight gained during pregnancy.  Breastfeeding makes your uterus contract to its prepregnancy size faster and slows bleeding (lochia) after you give birth.  Breastfeeding helps to lower your risk of developing type 2 diabetes mellitus, osteoporosis, and breast or ovarian cancer later in life.  Signs that your baby is hungry Early Signs of Hunger  Increased alertness or activity.  Stretching.  Movement of the head from side to  side.  Movement of the head and opening of the mouth when the corner of the mouth or cheek is stroked (rooting).  Increased sucking sounds, smacking lips, cooing, sighing, or squeaking.  Hand-to-mouth movements.  Increased sucking of fingers or hands.  Late Signs of Hunger  Fussing.  Intermittent crying.  Extreme Signs of Hunger Signs of extreme hunger will require calming and consoling before your baby will be able to breastfeed successfully. Do not wait for the following signs of extreme hunger to occur before you initiate breastfeeding:  Restlessness.  A loud, strong cry.  Screaming.  Breastfeeding basics Breastfeeding Initiation  Find a comfortable place to sit or lie down, with your neck and back well supported.  Place a pillow or rolled up blanket under your baby to bring him or her to the level of your breast (if you are seated). Nursing pillows are specially designed to help support your arms and your baby while you breastfeed.  Make sure that your baby's abdomen is facing your abdomen.  Gently massage your breast. With your fingertips, massage from your chest wall toward your nipple in a circular motion. This encourages milk flow. You may need to continue this action during the feeding if your milk flows slowly.  Support your breast with 4 fingers underneath and your thumb above your nipple. Make sure your fingers are well away from your nipple and your baby's mouth.  Stroke your baby's lips gently with your finger or nipple.  When your baby's mouth is open wide enough, quickly bring your baby to your breast, placing your entire nipple and as much of the colored area   around your nipple (areola) as possible into your baby's mouth. ? More areola should be visible above your baby's upper lip than below the lower lip. ? Your baby's tongue should be between his or her lower gum and your breast.  Ensure that your baby's mouth is correctly positioned around your nipple  (latched). Your baby's lips should create a seal on your breast and be turned out (everted).  It is common for your baby to suck about 2-3 minutes in order to start the flow of breast milk.  Latching Teaching your baby how to latch on to your breast properly is very important. An improper latch can cause nipple pain and decreased milk supply for you and poor weight gain in your baby. Also, if your baby is not latched onto your nipple properly, he or she may swallow some air during feeding. This can make your baby fussy. Burping your baby when you switch breasts during the feeding can help to get rid of the air. However, teaching your baby to latch on properly is still the best way to prevent fussiness from swallowing air while breastfeeding. Signs that your baby has successfully latched on to your nipple:  Silent tugging or silent sucking, without causing you pain.  Swallowing heard between every 3-4 sucks.  Muscle movement above and in front of his or her ears while sucking.  Signs that your baby has not successfully latched on to nipple:  Sucking sounds or smacking sounds from your baby while breastfeeding.  Nipple pain.  If you think your baby has not latched on correctly, slip your finger into the corner of your baby's mouth to break the suction and place it between your baby's gums. Attempt breastfeeding initiation again. Signs of Successful Breastfeeding Signs from your baby:  A gradual decrease in the number of sucks or complete cessation of sucking.  Falling asleep.  Relaxation of his or her body.  Retention of a small amount of milk in his or her mouth.  Letting go of your breast by himself or herself.  Signs from you:  Breasts that have increased in firmness, weight, and size 1-3 hours after feeding.  Breasts that are softer immediately after breastfeeding.  Increased milk volume, as well as a change in milk consistency and color by the fifth day of  breastfeeding.  Nipples that are not sore, cracked, or bleeding.  Signs That Your Baby is Getting Enough Milk  Wetting at least 1-2 diapers during the first 24 hours after birth.  Wetting at least 5-6 diapers every 24 hours for the first week after birth. The urine should be clear or pale yellow by 5 days after birth.  Wetting 6-8 diapers every 24 hours as your baby continues to grow and develop.  At least 3 stools in a 24-hour period by age 5 days. The stool should be soft and yellow.  At least 3 stools in a 24-hour period by age 7 days. The stool should be seedy and yellow.  No loss of weight greater than 10% of birth weight during the first 3 days of age.  Average weight gain of 4-7 ounces (113-198 g) per week after age 4 days.  Consistent daily weight gain by age 5 days, without weight loss after the age of 2 weeks.  After a feeding, your baby may spit up a small amount. This is common. Breastfeeding frequency and duration Frequent feeding will help you make more milk and can prevent sore nipples and breast engorgement. Breastfeed when   you feel the need to reduce the fullness of your breasts or when your baby shows signs of hunger. This is called "breastfeeding on demand." Avoid introducing a pacifier to your baby while you are working to establish breastfeeding (the first 4-6 weeks after your baby is born). After this time you may choose to use a pacifier. Research has shown that pacifier use during the first year of a baby's life decreases the risk of sudden infant death syndrome (SIDS). Allow your baby to feed on each breast as long as he or she wants. Breastfeed until your baby is finished feeding. When your baby unlatches or falls asleep while feeding from the first breast, offer the second breast. Because newborns are often sleepy in the first few weeks of life, you may need to awaken your baby to get him or her to feed. Breastfeeding times will vary from baby to baby. However,  the following rules can serve as a guide to help you ensure that your baby is properly fed:  Newborns (babies 4 weeks of age or younger) may breastfeed every 1-3 hours.  Newborns should not go longer than 3 hours during the day or 5 hours during the night without breastfeeding.  You should breastfeed your baby a minimum of 8 times in a 24-hour period until you begin to introduce solid foods to your baby at around 6 months of age.  Breast milk pumping Pumping and storing breast milk allows you to ensure that your baby is exclusively fed your breast milk, even at times when you are unable to breastfeed. This is especially important if you are going back to work while you are still breastfeeding or when you are not able to be present during feedings. Your lactation consultant can give you guidelines on how long it is safe to store breast milk. A breast pump is a machine that allows you to pump milk from your breast into a sterile bottle. The pumped breast milk can then be stored in a refrigerator or freezer. Some breast pumps are operated by hand, while others use electricity. Ask your lactation consultant which type will work best for you. Breast pumps can be purchased, but some hospitals and breastfeeding support groups lease breast pumps on a monthly basis. A lactation consultant can teach you how to hand express breast milk, if you prefer not to use a pump. Caring for your breasts while you breastfeed Nipples can become dry, cracked, and sore while breastfeeding. The following recommendations can help keep your breasts moisturized and healthy:  Avoid using soap on your nipples.  Wear a supportive bra. Although not required, special nursing bras and tank tops are designed to allow access to your breasts for breastfeeding without taking off your entire bra or top. Avoid wearing underwire-style bras or extremely tight bras.  Air dry your nipples for 3-4minutes after each feeding.  Use only cotton  bra pads to absorb leaked breast milk. Leaking of breast milk between feedings is normal.  Use lanolin on your nipples after breastfeeding. Lanolin helps to maintain your skin's normal moisture barrier. If you use pure lanolin, you do not need to wash it off before feeding your baby again. Pure lanolin is not toxic to your baby. You may also hand express a few drops of breast milk and gently massage that milk into your nipples and allow the milk to air dry.  In the first few weeks after giving birth, some women experience extremely full breasts (engorgement). Engorgement can make your   breasts feel heavy, warm, and tender to the touch. Engorgement peaks within 3-5 days after you give birth. The following recommendations can help ease engorgement:  Completely empty your breasts while breastfeeding or pumping. You may want to start by applying warm, moist heat (in the shower or with warm water-soaked hand towels) just before feeding or pumping. This increases circulation and helps the milk flow. If your baby does not completely empty your breasts while breastfeeding, pump any extra milk after he or she is finished.  Wear a snug bra (nursing or regular) or tank top for 1-2 days to signal your body to slightly decrease milk production.  Apply ice packs to your breasts, unless this is too uncomfortable for you.  Make sure that your baby is latched on and positioned properly while breastfeeding.  If engorgement persists after 48 hours of following these recommendations, contact your health care provider or a lactation consultant. Overall health care recommendations while breastfeeding  Eat healthy foods. Alternate between meals and snacks, eating 3 of each per day. Because what you eat affects your breast milk, some of the foods may make your baby more irritable than usual. Avoid eating these foods if you are sure that they are negatively affecting your baby.  Drink milk, fruit juice, and water to  satisfy your thirst (about 10 glasses a day).  Rest often, relax, and continue to take your prenatal vitamins to prevent fatigue, stress, and anemia.  Continue breast self-awareness checks.  Avoid chewing and smoking tobacco. Chemicals from cigarettes that pass into breast milk and exposure to secondhand smoke may harm your baby.  Avoid alcohol and drug use, including marijuana. Some medicines that may be harmful to your baby can pass through breast milk. It is important to ask your health care provider before taking any medicine, including all over-the-counter and prescription medicine as well as vitamin and herbal supplements. It is possible to become pregnant while breastfeeding. If birth control is desired, ask your health care provider about options that will be safe for your baby. Contact a health care provider if:  You feel like you want to stop breastfeeding or have become frustrated with breastfeeding.  You have painful breasts or nipples.  Your nipples are cracked or bleeding.  Your breasts are red, tender, or warm.  You have a swollen area on either breast.  You have a fever or chills.  You have nausea or vomiting.  You have drainage other than breast milk from your nipples.  Your breasts do not become full before feedings by the fifth day after you give birth.  You feel sad and depressed.  Your baby is too sleepy to eat well.  Your baby is having trouble sleeping.  Your baby is wetting less than 3 diapers in a 24-hour period.  Your baby has less than 3 stools in a 24-hour period.  Your baby's skin or the white part of his or her eyes becomes yellow.  Your baby is not gaining weight by 5 days of age. Get help right away if:  Your baby is overly tired (lethargic) and does not want to wake up and feed.  Your baby develops an unexplained fever. This information is not intended to replace advice given to you by your health care provider. Make sure you discuss  any questions you have with your health care provider. Document Released: 03/31/2005 Document Revised: 09/12/2015 Document Reviewed: 09/22/2012 Elsevier Interactive Patient Education  2017 [employer] Inc.  

## 2017-03-31 ENCOUNTER — Ambulatory Visit (INDEPENDENT_AMBULATORY_CARE_PROVIDER_SITE_OTHER): Payer: Medicaid Other | Admitting: Advanced Practice Midwife

## 2017-03-31 VITALS — BP 121/78 | HR 84 | Wt 178.0 lb

## 2017-03-31 DIAGNOSIS — O09213 Supervision of pregnancy with history of pre-term labor, third trimester: Secondary | ICD-10-CM

## 2017-03-31 DIAGNOSIS — Z3483 Encounter for supervision of other normal pregnancy, third trimester: Secondary | ICD-10-CM

## 2017-03-31 DIAGNOSIS — O99820 Streptococcus B carrier state complicating pregnancy: Secondary | ICD-10-CM

## 2017-03-31 DIAGNOSIS — Z3480 Encounter for supervision of other normal pregnancy, unspecified trimester: Secondary | ICD-10-CM

## 2017-03-31 DIAGNOSIS — Z8751 Personal history of pre-term labor: Secondary | ICD-10-CM

## 2017-03-31 NOTE — Progress Notes (Signed)
   PRENATAL VISIT NOTE  Subjective:  Beth Cordova is a 31 y.o. [id number] at 10039w1d being seen today for ongoing prenatal care.  She is currently monitored for the following issues for this low-risk pregnancy and has Supervision of other normal pregnancy, antepartum; History of preterm delivery; and Group B Streptococcus carrier, +RV culture, currently pregnant on their problem list.  Patient reports backache. Contractions: Not present. Vag. Bleeding: None.  Movement: Present. Denies leaking of fluid.   The following portions of the patient's history were reviewed and updated as appropriate: allergies, current medications, past family history, past medical history, past social history, past surgical history and problem list. Problem list updated.  Objective:   Vitals:   03/31/17 1007  BP: 121/78  Pulse: 84  Weight: 178 lb (80.7 kg)    Fetal Status: Fetal Heart Rate (bpm): 150 Fundal Height: 39 cm Movement: Present  Presentation: Vertex  General:  Alert, oriented and cooperative. Patient is in no acute distress.  Skin: Skin is warm and dry. No rash noted.   Cardiovascular: Normal heart rate noted  Respiratory: Normal respiratory effort, no problems with respiration noted  Abdomen: Soft, gravid, appropriate for gestational age.  Pain/Pressure: Present     Pelvic: Cervical exam performed Dilation: 3 Effacement (%): 0 Station: -3  Extremities: Normal range of motion.  Edema: Mild pitting, slight indentation  Mental Status:  Normal mood and affect. Normal behavior. Normal judgment and thought content.   Assessment and Plan:  Pregnancy: [id number] at 7539w1d  1. Supervision of other normal pregnancy, antepartum -Continue routine Prenatal care -Discussed Miles circuit movements to improve fetal position  2. History of preterm delivery   3. Group B Streptococcus carrier, +RV culture, currently pregnant -Will need treatment in labor  Term labor symptoms and general obstetric precautions  including but not limited to vaginal bleeding, contractions, leaking of fluid and fetal movement were reviewed in detail with the patient. Please refer to After Visit Summary for other counseling recommendations.  Follow up in 1 week  Beth Cordova, CNM

## 2017-04-04 ENCOUNTER — Encounter (HOSPITAL_COMMUNITY): Payer: Self-pay

## 2017-04-04 ENCOUNTER — Inpatient Hospital Stay (HOSPITAL_COMMUNITY)
Admission: AD | Admit: 2017-04-04 | Discharge: 2017-04-04 | Disposition: A | Discharge status: Home / Self Care | Payer: Medicaid Other | Source: Ambulatory Visit | Attending: Obstetrics and Gynecology | Admitting: Obstetrics and Gynecology

## 2017-04-04 DIAGNOSIS — Z3490 Encounter for supervision of normal pregnancy, unspecified, unspecified trimester: Secondary | ICD-10-CM | POA: Diagnosis not present

## 2017-04-04 DIAGNOSIS — Z3A00 Weeks of gestation of pregnancy not specified: Secondary | ICD-10-CM | POA: Insufficient documentation

## 2017-04-04 DIAGNOSIS — O471 False labor at or after 37 completed weeks of gestation: Secondary | ICD-10-CM

## 2017-04-04 LAB — URINALYSIS, ROUTINE W REFLEX MICROSCOPIC
Bilirubin Urine: NEGATIVE
GLUCOSE, UA: NEGATIVE mg/dL
HGB URINE DIPSTICK: NEGATIVE
Ketones, ur: NEGATIVE mg/dL
Leukocytes, UA: NEGATIVE
Nitrite: NEGATIVE
PH: 7 (ref 5.0–8.0)
PROTEIN: NEGATIVE mg/dL
SPECIFIC GRAVITY, URINE: 1.006 (ref 1.005–1.030)

## 2017-04-04 MED ORDER — OXYCODONE-ACETAMINOPHEN 5-325 MG PO TABS
1.0000 | ORAL_TABLET | Freq: Once | ORAL | Status: AC
  Administered 2017-04-04: 1 via ORAL
  Filled 2017-04-04: qty 1

## 2017-04-04 NOTE — MAU Note (Signed)
Contractions since this morning

## 2017-04-04 NOTE — Discharge Instructions (Signed)
Braxton Hicks Contractions °Contractions of the uterus can occur throughout pregnancy, but they are not always a sign that you are in labor. You may have practice contractions called Braxton Hicks contractions. These false labor contractions are sometimes confused with true labor. °What are Braxton Hicks contractions? °Braxton Hicks contractions are tightening movements that occur in the muscles of the uterus before labor. Unlike true labor contractions, these contractions do not result in opening (dilation) and thinning of the cervix. Toward the end of pregnancy (32-34 weeks), Braxton Hicks contractions can happen more often and may become stronger. These contractions are sometimes difficult to tell apart from true labor because they can be very uncomfortable. You should not feel embarrassed if you go to the hospital with false labor. °Sometimes, the only way to tell if you are in true labor is for your health care provider to look for changes in the cervix. The health care provider will do a physical exam and may monitor your contractions. If you are not in true labor, the exam should show that your cervix is not dilating and your water has not broken. °If there are other health problems associated with your pregnancy, it is completely safe for you to be sent home with false labor. You may continue to have Braxton Hicks contractions until you go into true labor. °How to tell the difference between true labor and false labor °True labor °· Contractions last 30-70 seconds. °· Contractions become very regular. °· Discomfort is usually felt in the top of the uterus, and it spreads to the lower abdomen and low back. °· Contractions do not go away with walking. °· Contractions usually become more intense and increase in frequency. °· The cervix dilates and gets thinner. °False labor °· Contractions are usually shorter and not as strong as true labor contractions. °· Contractions are usually irregular. °· Contractions  are often felt in the front of the lower abdomen and in the groin. °· Contractions may go away when you walk around or change positions while lying down. °· Contractions get weaker and are shorter-lasting as time goes on. °· The cervix usually does not dilate or become thin. °Follow these instructions at home: °· Take over-the-counter and prescription medicines only as told by your health care provider. °· Keep up with your usual exercises and follow other instructions from your health care provider. °· Eat and drink lightly if you think you are going into labor. °· If Braxton Hicks contractions are making you uncomfortable: °? Change your position from lying down or resting to walking, or change from walking to resting. °? Sit and rest in a tub of warm water. °? Drink enough fluid to keep your urine pale yellow. Dehydration may cause these contractions. °? Do slow and deep breathing several times an hour. °· Keep all follow-up prenatal visits as told by your health care provider. This is important. °Contact a health care provider if: °· You have a fever. °· You have continuous pain in your abdomen. °Get help right away if: °· Your contractions become stronger, more regular, and closer together. °· You have fluid leaking or gushing from your vagina. °· You pass blood-tinged mucus (bloody show). °· You have bleeding from your vagina. °· You have low back pain that you never had before. °· You feel your baby’s head pushing down and causing pelvic pressure. °· Your baby is not moving inside you as much as it used to. °Summary °· Contractions that occur before labor are called Braxton   Hicks contractions, false labor, or practice contractions. °· Braxton Hicks contractions are usually shorter, weaker, farther apart, and less regular than true labor contractions. True labor contractions usually become progressively stronger and regular and they become more frequent. °· Manage discomfort from Braxton Hicks contractions by  changing position, resting in a warm bath, drinking plenty of water, or practicing deep breathing. °This information is not intended to replace advice given to you by your health care provider. Make sure you discuss any questions you have with your health care provider. °Document Released: 08/14/2016 Document Revised: 08/14/2016 Document Reviewed: 08/14/2016 °Elsevier Interactive Patient Education © 2018 Else[employer] Inc. ° °

## 2017-04-09 ENCOUNTER — Inpatient Hospital Stay (HOSPITAL_COMMUNITY): Payer: Medicaid Other | Admitting: Anesthesiology

## 2017-04-09 ENCOUNTER — Encounter (HOSPITAL_COMMUNITY): Payer: Self-pay

## 2017-04-09 ENCOUNTER — Inpatient Hospital Stay (HOSPITAL_COMMUNITY)
Admission: AD | Admit: 2017-04-09 | Discharge: 2017-04-11 | DRG: 807 | Disposition: A | Discharge status: Home / Self Care | Payer: Medicaid Other | Source: Ambulatory Visit | Attending: Family Medicine | Admitting: Family Medicine

## 2017-04-09 ENCOUNTER — Encounter: Payer: Medicaid Other | Admitting: Obstetrics and Gynecology

## 2017-04-09 ENCOUNTER — Other Ambulatory Visit: Payer: Self-pay

## 2017-04-09 DIAGNOSIS — Z3483 Encounter for supervision of other normal pregnancy, third trimester: Secondary | ICD-10-CM | POA: Diagnosis present

## 2017-04-09 DIAGNOSIS — O99820 Streptococcus B carrier state complicating pregnancy: Secondary | ICD-10-CM

## 2017-04-09 DIAGNOSIS — O99824 Streptococcus B carrier state complicating childbirth: Secondary | ICD-10-CM | POA: Diagnosis present

## 2017-04-09 DIAGNOSIS — Z3A39 39 weeks gestation of pregnancy: Secondary | ICD-10-CM

## 2017-04-09 DIAGNOSIS — Z87891 Personal history of nicotine dependence: Secondary | ICD-10-CM

## 2017-04-09 LAB — CBC
HCT: 37.4 % (ref 36.0–46.0)
HEMOGLOBIN: 13.1 g/dL (ref 12.0–15.0)
MCH: 32.5 pg (ref 26.0–34.0)
MCHC: 35 g/dL (ref 30.0–36.0)
MCV: 92.8 fL (ref 78.0–100.0)
PLATELETS: 215 10*3/uL (ref 150–400)
RBC: 4.03 MIL/uL (ref 3.87–5.11)
RDW: 12.9 % (ref 11.5–15.5)
WBC: 9.6 10*3/uL (ref 4.0–10.5)

## 2017-04-09 LAB — RPR: RPR: NONREACTIVE

## 2017-04-09 LAB — ABO/RH: ABO/RH(D): O POS

## 2017-04-09 LAB — TYPE AND SCREEN
ABO/RH(D): O POS
ANTIBODY SCREEN: NEGATIVE

## 2017-04-09 MED ORDER — OXYTOCIN BOLUS FROM INFUSION
500.0000 mL | Freq: Once | INTRAVENOUS | Status: AC
  Administered 2017-04-09: 500 mL via INTRAVENOUS

## 2017-04-09 MED ORDER — BENZOCAINE-MENTHOL 20-0.5 % EX AERO
1.0000 "application " | INHALATION_SPRAY | CUTANEOUS | Status: DC | PRN
  Administered 2017-04-09: 1 via TOPICAL
  Filled 2017-04-09: qty 56

## 2017-04-09 MED ORDER — ZOLPIDEM TARTRATE 5 MG PO TABS
5.0000 mg | ORAL_TABLET | Freq: Every evening | ORAL | Status: DC | PRN
Start: 2017-04-09 — End: 2017-04-11

## 2017-04-09 MED ORDER — ONDANSETRON HCL 4 MG/2ML IJ SOLN: 4.0000 mg | Freq: Four times a day (QID) | INTRAMUSCULAR | Status: DC | PRN

## 2017-04-09 MED ORDER — DIPHENHYDRAMINE HCL 50 MG/ML IJ SOLN: 12.5000 mg | INTRAMUSCULAR | Status: DC | PRN

## 2017-04-09 MED ORDER — EPHEDRINE 5 MG/ML INJ
10.0000 mg | INTRAVENOUS | Status: DC | PRN
  Filled 2017-04-09: qty 2

## 2017-04-09 MED ORDER — FLEET ENEMA 7-19 GM/118ML RE ENEM: 1.0000 | ENEMA | RECTAL | Status: DC | PRN

## 2017-04-09 MED ORDER — COCONUT OIL OIL
1.0000 "application " | TOPICAL_OIL | Status: DC | PRN
  Administered 2017-04-10: 1 via TOPICAL
  Filled 2017-04-09: qty 120

## 2017-04-09 MED ORDER — PRENATAL MULTIVITAMIN CH
1.0000 | ORAL_TABLET | Freq: Every day | ORAL | Status: DC
  Administered 2017-04-09 – 2017-04-10 (×2): 1 via ORAL
  Filled 2017-04-09 (×2): qty 1

## 2017-04-09 MED ORDER — TETANUS-DIPHTH-ACELL PERTUSSIS 5-2.5-18.5 LF-MCG/0.5 IM SUSP: 0.5000 mL | Freq: Once | INTRAMUSCULAR | Status: DC

## 2017-04-09 MED ORDER — LIDOCAINE HCL (PF) 1 % IJ SOLN
INTRAMUSCULAR | Status: DC | PRN
  Administered 2017-04-09: 4 mL
  Administered 2017-04-09: 6 mL via EPIDURAL

## 2017-04-09 MED ORDER — ACETAMINOPHEN 325 MG PO TABS: 650.0000 mg | ORAL_TABLET | ORAL | Status: DC | PRN

## 2017-04-09 MED ORDER — PHENYLEPHRINE 40 MCG/ML (10ML) SYRINGE FOR IV PUSH (FOR BLOOD PRESSURE SUPPORT)
80.0000 ug | PREFILLED_SYRINGE | INTRAVENOUS | Status: DC | PRN
  Filled 2017-04-09: qty 5
  Filled 2017-04-09: qty 10

## 2017-04-09 MED ORDER — LACTATED RINGERS IV SOLN
INTRAVENOUS | Status: DC
  Administered 2017-04-09: 08:00:00 via INTRAVENOUS

## 2017-04-09 MED ORDER — IBUPROFEN 600 MG PO TABS
600.0000 mg | ORAL_TABLET | Freq: Four times a day (QID) | ORAL | Status: DC
  Administered 2017-04-09 – 2017-04-11 (×8): 600 mg via ORAL
  Filled 2017-04-09 (×8): qty 1

## 2017-04-09 MED ORDER — DIPHENHYDRAMINE HCL 25 MG PO CAPS: 25.0000 mg | ORAL_CAPSULE | Freq: Four times a day (QID) | ORAL | Status: DC | PRN

## 2017-04-09 MED ORDER — SENNOSIDES-DOCUSATE SODIUM 8.6-50 MG PO TABS
2.0000 | ORAL_TABLET | ORAL | Status: DC
  Administered 2017-04-09 – 2017-04-11 (×2): 2 via ORAL
  Filled 2017-04-09 (×2): qty 2

## 2017-04-09 MED ORDER — ACETAMINOPHEN 325 MG PO TABS
650.0000 mg | ORAL_TABLET | ORAL | Status: DC | PRN
  Administered 2017-04-09 – 2017-04-11 (×9): 650 mg via ORAL
  Filled 2017-04-09 (×9): qty 2

## 2017-04-09 MED ORDER — DIBUCAINE 1 % RE OINT: 1.0000 "application " | TOPICAL_OINTMENT | RECTAL | Status: DC | PRN

## 2017-04-09 MED ORDER — LIDOCAINE HCL (PF) 1 % IJ SOLN
30.0000 mL | INTRAMUSCULAR | Status: DC | PRN
  Filled 2017-04-09: qty 30

## 2017-04-09 MED ORDER — ONDANSETRON HCL 4 MG/2ML IJ SOLN: 4.0000 mg | INTRAMUSCULAR | Status: DC | PRN

## 2017-04-09 MED ORDER — OXYCODONE-ACETAMINOPHEN 5-325 MG PO TABS: 1.0000 | ORAL_TABLET | ORAL | Status: DC | PRN

## 2017-04-09 MED ORDER — PHENYLEPHRINE 40 MCG/ML (10ML) SYRINGE FOR IV PUSH (FOR BLOOD PRESSURE SUPPORT)
80.0000 ug | PREFILLED_SYRINGE | INTRAVENOUS | Status: DC | PRN
  Administered 2017-04-09: 80 ug via INTRAVENOUS
  Filled 2017-04-09: qty 5

## 2017-04-09 MED ORDER — ONDANSETRON HCL 4 MG PO TABS: 4.0000 mg | ORAL_TABLET | ORAL | Status: DC | PRN

## 2017-04-09 MED ORDER — FENTANYL CITRATE (PF) 100 MCG/2ML IJ SOLN: 100.0000 ug | INTRAMUSCULAR | Status: DC | PRN

## 2017-04-09 MED ORDER — SIMETHICONE 80 MG PO CHEW: 80.0000 mg | CHEWABLE_TABLET | ORAL | Status: DC | PRN

## 2017-04-09 MED ORDER — OXYCODONE-ACETAMINOPHEN 5-325 MG PO TABS: 2.0000 | ORAL_TABLET | ORAL | Status: DC | PRN

## 2017-04-09 MED ORDER — LACTATED RINGERS IV SOLN
500.0000 mL | Freq: Once | INTRAVENOUS | Status: AC
  Administered 2017-04-09: 500 mL via INTRAVENOUS

## 2017-04-09 MED ORDER — SOD CITRATE-CITRIC ACID 500-334 MG/5ML PO SOLN: 30.0000 mL | ORAL | Status: DC | PRN

## 2017-04-09 MED ORDER — FENTANYL 2.5 MCG/ML BUPIVACAINE 1/10 % EPIDURAL INFUSION (WH - ANES)
14.0000 mL/h | INTRAMUSCULAR | Status: DC | PRN
  Administered 2017-04-09: 14 mL/h via EPIDURAL
  Filled 2017-04-09: qty 100

## 2017-04-09 MED ORDER — OXYTOCIN 40 UNITS IN LACTATED RINGERS INFUSION - SIMPLE MED
2.5000 [IU]/h | INTRAVENOUS | Status: DC
  Administered 2017-04-09: 2.5 [IU]/h via INTRAVENOUS
  Filled 2017-04-09: qty 1000

## 2017-04-09 MED ORDER — LACTATED RINGERS IV SOLN
500.0000 mL | INTRAVENOUS | Status: DC | PRN
  Administered 2017-04-09: 1000 mL via INTRAVENOUS

## 2017-04-09 MED ORDER — SODIUM CHLORIDE 0.9 % IV SOLN
2.0000 g | Freq: Once | INTRAVENOUS | Status: AC
  Administered 2017-04-09: 2 g via INTRAVENOUS
  Filled 2017-04-09: qty 2000

## 2017-04-09 MED ORDER — WITCH HAZEL-GLYCERIN EX PADS: 1.0000 "application " | MEDICATED_PAD | CUTANEOUS | Status: DC | PRN

## 2017-04-09 NOTE — Lactation Note (Signed)
This note was copied from a baby's chart. Lactation Consultation Note  Patient Name: [name] ZOXWR'UToday's Date: 04/09/2017 Reason for consult: Initial assessment   Initial consult with mom of 4 hour old infant. Infant with 2 BF for 10-20 minutes, 2 BF attempts, and 1 stool since birth. LATCH score 9-10. Infant currently asleep in visitors arms.   Mom reports she BF her 89 yo for 2 months. She reports her milk supply dried up after returning to work. [facility] and feels she has learned new information.   Mom reports infant has fed well since birth. She has no questions/concerns. Enc mom to feed infant STS 8-12 x in 24 hours at first feeding cues. Enc mom to use pillow and head support with feeding. Mom reports she has been taught to hand express.   BF Resources Handout and LC Brochure given, mom informed of IP/OP Services, BF Support Groups and LC phone #.   Mom reports she has no questions/concerns at this time. Mom to call out for feeding assistance as needed.    Maternal Data Formula Feeding for Exclusion: No Has patient been taught Hand Expression?: Yes Does the patient have breastfeeding experience prior to this delivery?: Yes  Feeding Feeding Type: Breast Fed Length of feed: 5 min  LATCH Score Latch: Grasps breast easily, tongue down, lips flanged, rhythmical sucking.  Audible Swallowing: A few with stimulation  Type of Nipple: Everted at rest and after stimulation  Comfort (Breast/Nipple): Soft / non-tender  Hold (Positioning): No assistance needed to correctly position infant at breast.  LATCH Score: 9  Interventions Interventions: Breast feeding basics reviewed;Support pillows;Assisted with latch;Position options;Skin to skin;Expressed milk;Breast compression;Hand express;Breast massage  Lactation Tools Discussed/Used WIC Program: Yes   Consult Status Consult Status: Follow-up Date: 04/10/17 Follow-up type:  In-patient    [name] 04/09/2017, 2:03 PM

## 2017-04-09 NOTE — Anesthesia Pain Management Evaluation Note (Signed)
  [facility] Pain Management Visit Note  Patient: Beth Cordova, 31 y.o., female  "Hello [facility] am [facility]. We have an anesthesia team available at all times to provide care throughout the hospital, including epidural management and anesthesia for C-section. I don't know your plan for the delivery whether it a natural birth, water birth, IV sedation, nitrous supplementation, doula or epidural, but we want to meet your pain goals."   1.Was your pain managed to your expectations on prior hospitalizations?   Yes   2.What is your expectation for pain management during this hospitalization?     Epidural  3.How can we help you reach that goal? epidural  Record the patient's initial score and the patient's pain goal.   Pain: 0  Pain Goal: 4 [facility] wants you to be able to say your pain was always managed very well.  Khole Branch 04/09/2017

## 2017-04-09 NOTE — Anesthesia Procedure Notes (Signed)
Epidural Patient location during procedure: OB  Staffing Anesthesiologist: Trenae Brunke, MD  Preanesthetic Checklist Completed: patient identified, pre-op evaluation, timeout performed, IV checked, risks and benefits discussed and monitors and equipment checked  Epidural Patient position: sitting Prep: DuraPrep Patient monitoring: blood pressure and continuous pulse ox Approach: right paramedian Location: L3-L4 Injection technique: LOR air  Needle:  Needle type: Tuohy  Needle gauge: 17 G Needle insertion depth: 5 cm Catheter type: closed end flexible Catheter size: 19 Gauge Catheter at skin depth: 10 cm Test dose: negative  Assessment Sensory level: T8  Additional Notes   Dosing of Epidural:  1st dose, through catheter .............................................  Xylocaine 40 mg  2nd dose, through catheter, after waiting 3 minutes.........Xylocaine 60 mg    As each dose occurred, patient was free of IV sx; and patient exhibited no evidence of SA injection.  Patient is more comfortable after epidural dosed. Please see RN's note for documentation of vital signs,and FHR which are stable.  Patient reminded not to try to ambulate with numb legs, and that an RN must be present when she attempts to get up.          

## 2017-04-09 NOTE — Anesthesia Preprocedure Evaluation (Signed)
Anesthesia Evaluation  Patient identified by MRN, date of birth, ID band Patient awake    Reviewed: Allergy & Precautions, H&P , Patient's Chart, lab work & pertinent test results  Airway Mallampati: II  TM Distance: >3 FB Neck ROM: full    Dental  (+) Teeth Intact   Pulmonary former smoker,    breath sounds clear to auscultation       Cardiovascular  Rhythm:regular Rate:Normal     Neuro/Psych    GI/Hepatic   Endo/Other    Renal/GU      Musculoskeletal   Abdominal   Peds  Hematology   Anesthesia Other Findings       Reproductive/Obstetrics (+) Pregnancy                             Anesthesia Physical  Anesthesia Plan  ASA: II  Anesthesia Plan: Epidural   Post-op Pain Management:    Induction:   PONV Risk Score and Plan:   Airway Management Planned:   Additional Equipment:   Intra-op Plan:   Post-operative Plan:   Informed Consent: I have reviewed the patients History and Physical, chart, labs and discussed the procedure including the risks, benefits and alternatives for the proposed anesthesia with the patient or authorized representative who has indicated his/her understanding and acceptance.   Dental Advisory Given  Plan Discussed with:   Anesthesia Plan Comments: (Labs checked- platelets confirmed with RN in room. Fetal heart tracing, per RN, reported to be stable enough for sitting procedure. Discussed epidural, and patient consents to the procedure:  included risk of possible headache,backache, failed block, allergic reaction, and nerve injury. This patient was asked if she had any questions or concerns before the procedure started.)        Anesthesia Quick Evaluation  

## 2017-04-09 NOTE — MAU Note (Signed)
Pt states cntrx started at 0400; 3 mins apart;Denies LOF or bleeding; +FM.

## 2017-04-09 NOTE — H&P (Signed)
Obstetric History and Physical  Beth Cordova is a 31 y.o. [id number] with IUP at 225w3d presenting for SOL. Patient states she has been having  regular, every 2-3 minutes contractions, none vaginal bleeding, intact membranes, with active fetal movement.    Prenatal Course Source of Care: [facility] Dating: By LMP --->  Estimated Date of Delivery: 04/13/17 Pregnancy complications or risks: Patient Active Problem List   Diagnosis Date Noted  . Group B Streptococcus carrier, +RV culture, currently pregnant 03/26/2017  . Supervision of other normal pregnancy, antepartum 12/23/2016  . History of preterm delivery 12/23/2016   She plans to breastfeed She is undecided for postpartum contraception.   Sono:    @[redacted]w[redacted]d , CWD, normal anatomy, cephalic presentation, posterior placenta, 2427g, 79% EFW  Prenatal labs and studies: ABO, Rh: O/Positive/-- (09/11 1418) Antibody: Negative (09/11 1418) Rubella: 3.06 (09/11 1418) RPR: Non Reactive (10/09 0951)  HBsAg: Negative (09/11 1418)  HIV:   NEGATIVE GBS: POSITIVE 2 hr Glucola  normal Genetic screening normal Anatomy US normal  Prenatal Transfer Tool  Maternal Diabetes: No Genetic Screening: Normal Maternal Ultrasounds/Referrals: Normal Fetal Ultrasounds or other Referrals:  None Maternal Substance Abuse:  No Significant Maternal Medications:  None Significant Maternal Lab Results: Lab values include: Group B Strep positive  Past Medical History:  Diagnosis Date  . Bowel dysfunction    States she has to eat 5 small meals a day  . Bowel obstruction (HCC)   . Infection    UTI  . Ovarian cyst   . Trichomonas infection     Past Surgical History:  Procedure Laterality Date  . NO PAST SURGERIES    . WISDOM TOOTH EXTRACTION      OB History  Gravida Para Term Preterm AB Living  5 4 3 1  0 3  SAB TAB Ectopic Multiple Live Births  0       4    # Outcome Date GA Lbr Len/2nd Weight Sex Delivery Anes PTL Lv  5 Current           4 Preterm  04/09/15 10574w0d  0.227 kg (8 oz) F   Y ND  3 Term 04/24/12 637w0d  2.637 kg (5 lb 13 oz) F Vag-Spont EPI  LIV  2 Term 04/01/05 267w0d  3.062 kg (6 lb 12 oz) F Vag-Spont EPI N LIV  1 Term 04/08/04 597w0d  2.863 kg (6 lb 5 oz) F Vag-Spont EPI N LIV      Social History   Socioeconomic History  . Marital status: Single    Spouse name: None  . Number of children: None  . Years of education: None  . Highest education level: None  Social Needs  . Financial resource strain: None  . Food insecurity - worry: None  . Food insecurity - inability: None  . Transportation needs - medical: None  . Transportation needs - non-medical: None  Occupational History  . None  Tobacco Use  . Smoking status: Former Games developermoker  . Smokeless tobacco: Never Used  . Tobacco comment: quit Aug 2018  Substance and Sexual Activity  . Alcohol use: No  . Drug use: No  . Sexual activity: No    Birth control/protection: None  Other Topics Concern  . None  Social History Narrative  . None    Family History  Problem Relation Age of Onset  . Hypertension Father   . Asthma Brother   . Cancer Maternal Grandmother        bone  . Hypertension Maternal Grandmother   .  Stroke Maternal Grandmother   . Diabetes Paternal Grandmother   . Diabetes Paternal Grandfather     Medications Prior to Admission  Medication Sig Dispense Refill Last Dose  . Prenatal Vit-Fe Fumarate-FA (PRENATAL VITAMINS) 28-0.8 MG TABS Take 1 tablet by mouth daily. 60 tablet 3 04/04/2017 at Unknown time    No Known Allergies  Review of Systems: Negative except for what is mentioned in HPI.  Physical Exam: BP 130/83 (BP Location: Right Arm) Comment: with contraction  Pulse 73   Temp 97.8 F (36.6 C)   Resp 16   LMP 07/07/2016 (Exact Date)  CONSTITUTIONAL: Well-developed, well-nourished female in no acute distress.  HENT:  Normocephalic, atraumatic, External right and left ear normal. Oropharynx is clear and moist EYES: Conjunctivae and  EOM are normal. Pupils are equal, round, and reactive to light. No scleral icterus.  NECK: Normal range of motion, supple, no masses SKIN: Skin is warm and dry. No rash noted. Not diaphoretic. No erythema. No pallor. NEUROLOGIC: Alert and oriented to person, place, and time. Normal reflexes, muscle tone coordination. No cranial nerve deficit noted. PSYCHIATRIC: Normal mood and affect. Normal behavior. Normal judgment and thought content. CARDIOVASCULAR: Normal heart rate noted, regular rhythm RESPIRATORY: Effort and breath sounds normal, no problems with respiration noted ABDOMEN: Soft, nontender, nondistended, gravid. MUSCULOSKELETAL: Normal range of motion. No edema and no tenderness. 2+ distal pulses.  Cervical Exam: Dilation: 6 Effacement (%): 80  Presentation: cephalic FHT:  Baseline rate 125 bpm   Variability moderate  Accelerations present   Decelerations none Contractions: Every 2-3 mins   Pertinent Labs/Studies:   No results found for this or any previous visit (from the past 24 hour(s)).  Assessment : Beth Cordova is a 31 y.o. [id number] at 2354w3d being admitted for labor.  Plan: Labor: Expectant management. Augmentation as needed.  Plan for epidural FWB: Reassuring fetal heart tracing.   GBS positive - start Ampicillin Delivery plan: Hopeful for vaginal delivery   Beth AdaJazma Machele Deihl, DO OB Fellow Faculty Practice, [facility] 04/09/2017, 6:23 AM

## 2017-04-09 NOTE — Anesthesia Postprocedure Evaluation (Signed)
Anesthesia Post Note  Patient: Leisure centre manageravanah Stilwell  Procedure(s) Performed: AN AD HOC LABOR EPIDURAL     Patient location during evaluation: Mother Baby Anesthesia Type: Epidural Level of consciousness: awake and alert and oriented Pain management: satisfactory to patient Vital Signs Assessment: post-procedure vital signs reviewed and stable Respiratory status: spontaneous breathing and nonlabored ventilation Cardiovascular status: stable Postop Assessment: no headache, no backache, no signs of nausea or vomiting, adequate PO intake and patient able to bend at knees (patient up walking) Anesthetic complications: no    Last Vitals:  Vitals:   04/09/17 1146 04/09/17 1239  BP: (!) 99/55 99/63  Pulse: 75 69  Resp: 17 18  Temp:  36.5 C  SpO2:      Last Pain:  Vitals:   04/09/17 1240  TempSrc:   PainSc: 5    Pain Goal:                 Madison HickmanGREGORY,Aija Scarfo

## 2017-04-10 NOTE — Clinical Social Work Maternal (Signed)
CLINICAL SOCIAL WORK MATERNAL/CHILD NOTE  Patient Details  Name: Beth Cordova MRN: [id number] Date of Birth: 03/28/1986  Date:  04/10/2017  Clinical Social Worker Initiating Note:  Jamilynn Whitacre, LCSW Date/Time: Initiated:  04/10/17/1654     Child's Name:  Beth Cordova   Biological Parents:  Mother, Father   Need for Interpreter:  None   Reason for Referral:  (MOB at [facility] and does not have custody of her other children. )   Address:  [address] [place] [address]    Phone number:  [phone] (home)     Additional phone number:   Household Members/Support Persons (HM/SP):       HM/SP Name Relationship DOB or Age  HM/SP -1        HM/SP -2        HM/SP -3        HM/SP -4        HM/SP -5        HM/SP -6        HM/SP -7        HM/SP -8          Natural Supports (not living in the home):  Parent   Professional Supports: Other (Comment)([facility] staff )   Employment: Unemployed   Type of Work:     Education:  Some College   Homebound arranged:    Financial Resources:  Medicaid   Other Resources:  WIC   Cultural/Religious Considerations Which May Impact Care:  none identified.   Strengths:  Ability to meet basic needs , Home prepared for child , Pediatrician chosen   Psychotropic Medications:         Pediatrician:    [place] area  Pediatrician List:    [facility]  [place]    [place]    [place]    [place]    [place]      Pediatrician Fax Number:    Risk Factors/Current Problems:  None(lives at [facility] )   Cognitive State:      Mood/Affect:  Calm , Comfortable    CSW Assessment: LCSW met with MOB and discussed reason for the referral. MOB was bonding with the infant as evidenced by holding the baby.  MOB has been at [facility] since August. She states that prior to that she lived with there father in [place].  MOB states  that she came to this are after her father moved to [place].  She states that her, her mother, her father and her three children were all living together until her parents separated a year ago. MOB states that her children stayed with her mother and she went with her father. She reports that she has given her mother legal custody of her three other children until she can get on her feet.  MOB stated that the custody transfer was completed with a privately paid attorney and not through CPS.  She states that she has no active CPS involvement but endorses a history of CPS involvement years back. MOB denies behavioral diagnosis and states that she has not abused substances more than five years.  MOB reports extensive support from staff at [facility] and her parents.  She states that she has the basic necessities for the baby.  LCSW spoke with Eric Chin at [place] [employer] and he confirmed that MOB did not have an open CPS case.   LCSW spoke with Tonia Macchia at [place] [employer] and she confirmed that MOB did not have an open case with the department. She stated that it appeared that there had been a custody transfer of MOB's three other children to her mother.  LCSW provided education regarding the baby blues period vs. Perinatal mood disorders, discussed treatment and gave resources for mental health follow up if concerns arise. LCSW recommended self-evaluation during the postpartum time period using the New Mom Checklist form  Postpartum Progress and encouraged MOB to contact a medical professional if symptoms are noted at any time. LCSW assessed for safety and MOB denied SI, HI, DV.  MOB did not present with any acute signs and symptoms and appeared to have insight and awareness.  MOB agreed to seek help if help is needed.   There are no identifiable barriers to discharge.     CSW Plan/Description:  No Further Intervention Required/No Barriers to Discharge    Prisha Hiley D, LCSW 04/10/2017,  4:57 PM 

## 2017-04-10 NOTE — Lactation Note (Signed)
This note was copied from a baby's chart. Lactation Consultation Note  Patient Name: Beth Lucile ShuttersSavanah Stilwell BJYNW'GToday's Date: 04/10/2017 Reason for consult: Follow-up assessment;Term  Baby is 33 hours and has been feeding consistently,  LC reviewed doc flow sheets , and per mom and RN baby has been fussy.  LC recommended prior to latch - breast massage, hand express, pre-pump  Before very feeding on the 1st breast to prime the milk ducts for more calories  For feeding  Sore nipple and engorgement prevention and tx reviewed.  LC instructed mom on the use of hand pump .  Mother informed of post-discharge support and given phone number to the lactation department, including services for phone call assistance; out-patient appointments; and breastfeeding support group. List of other breastfeeding resources in the community given in the handout. Encouraged mother to call for problems or concerns related to breastfeeding.   Maternal Data Has patient been taught Hand Expression?: Yes  Feeding Feeding Type: Breast Fed Length of feed: 20 min  LATCH Score Latch: Grasps breast easily, tongue down, lips flanged, rhythmical sucking.  Audible Swallowing: Spontaneous and intermittent  Type of Nipple: Everted at rest and after stimulation  Comfort (Breast/Nipple): Filling, red/small blisters or bruises, mild/mod discomfort  Hold (Positioning): Assistance needed to correctly position infant at breast and maintain latch.  LATCH Score: 8  Interventions Interventions: Breast feeding basics reviewed;Assisted with latch;Skin to skin;Breast massage;Hand express;Pre-pump if needed;Breast compression;Adjust position;Support pillows;Position options  Lactation Tools Discussed/Used Tools: Pump Breast pump type: Manual Pump Review: Setup, frequency, and cleaning;Milk Storage Initiated by:: MAI  Date initiated:: 04/10/17   Consult Status Consult Status: Follow-up Date: 04/11/17 Follow-up type:  In-patient    Matilde SprangMargaret Ann Naome Brigandi 04/10/2017, 7:28 PM

## 2017-04-10 NOTE — Progress Notes (Signed)
Post Partum Day 1 Subjective: no complaints, up ad lib, voiding and tolerating PO  Objective: Blood pressure (!) 104/55, pulse 71, temperature 98 F (36.7 C), temperature source Oral, resp. rate 16, height 5\' 2"  (1.575 m), weight 180 lb (81.6 kg), last menstrual period 07/07/2016, SpO2 99 %, unknown if currently breastfeeding.  Physical Exam:  General: alert, cooperative and no distress Lochia: appropriate Uterine Fundus: firm DVT Evaluation: No evidence of DVT seen on physical exam. Negative Homan's sign. No cords or calf tenderness. No significant calf/ankle edema.  Recent Labs    04/09/17 0624  HGB 13.1  HCT 37.4    Assessment/Plan: Plan for discharge tomorrow, Breastfeeding, Lactation consult and Social Work consult   LOS: 1 day   Beth Cordova 04/10/2017, 7:42 AM

## 2017-04-10 NOTE — Progress Notes (Signed)
MOB is involved with [facility]. Program offers extensive support and case management to meet the needs of the mother and infant.  LCSW will follow up with [facility].     Tiquan Bouch D, LCSW    

## 2017-04-11 MED ORDER — IBUPROFEN 600 MG PO TABS: 600.0000 mg | ORAL_TABLET | Freq: Four times a day (QID) | ORAL | 0 refills | Status: DC

## 2017-04-11 NOTE — Discharge Instructions (Signed)
Postpartum Care After Vaginal Delivery °The period of time right after you deliver your newborn is called the postpartum period. °What kind of medical care will I receive? °· You may continue to receive fluids and medicines through an IV tube inserted into one of your veins. °· If an incision was made near your vagina (episiotomy) or if you had some vaginal tearing during delivery, cold compresses may be placed on your episiotomy or your tear. This helps to reduce pain and swelling. °· You may be given a squirt bottle to use when you go to the bathroom. You may use this until you are comfortable wiping as usual. To use the squirt bottle, follow these steps: °? Before you urinate, fill the squirt bottle with warm water. Do not use hot water. °? After you urinate, while you are sitting on the toilet, use the squirt bottle to rinse the area around your urethra and vaginal opening. This rinses away any urine and blood. °? You may do this instead of wiping. As you start healing, you may use the squirt bottle before wiping yourself. Make sure to wipe gently. °? Fill the squirt bottle with clean water every time you use the bathroom. °· You will be given sanitary pads to wear. °How can I expect to feel? °· You may not feel the need to urinate for several hours after delivery. °· You will have some soreness and pain in your abdomen and vagina. °· If you are breastfeeding, you may have uterine contractions every time you breastfeed for up to several weeks postpartum. Uterine contractions help your uterus return to its normal size. °· It is normal to have vaginal bleeding (lochia) after delivery. The amount and appearance of lochia is often similar to a menstrual period in the first week after delivery. It will gradually decrease over the next few weeks to a dry, yellow-brown discharge. For most women, lochia stops completely by 6-8 weeks after delivery. Vaginal bleeding can vary from woman to woman. °· Within the first few  days after delivery, you may have breast engorgement. This is when your breasts feel heavy, full, and uncomfortable. Your breasts may also throb and feel hard, tightly stretched, warm, and tender. After this occurs, you may have milk leaking from your breasts. Your health care provider can help you relieve discomfort due to breast engorgement. Breast engorgement should go away within a few days. °· You may feel more sad or worried than normal due to hormonal changes after delivery. These feelings should not last more than a few days. If these feelings do not go away after several days, speak with your health care provider. °How should I care for myself? °· Tell your health care provider if you have pain or discomfort. °· Drink enough water to keep your urine clear or pale yellow. °· Wash your hands thoroughly with soap and water for at least 20 seconds after changing your sanitary pads, after using the toilet, and before holding or feeding your baby. °· If you are not breastfeeding, avoid touching your breasts a lot. Doing this can make your breasts produce more milk. °· If you become weak or lightheaded, or you feel like you might faint, ask for help before: °? Getting out of bed. °? Showering. °· Change your sanitary pads frequently. Watch for any changes in your flow, such as a sudden increase in volume, a change in color, the passing of large blood clots. If you pass a blood clot from your vagina, save it   to show to your health care provider. Do not flush blood clots down the toilet without having your health care provider look at them.  Make sure that all your vaccinations are up to date. This can help protect you and your baby from getting certain diseases. You may need to have immunizations done before you leave the hospital.  If desired, talk with your health care provider about methods of family planning or birth control (contraception). How can I start bonding with my baby? Spending as much time as  possible with your baby is very important. During this time, you and your baby can get to know each other and develop a bond. Having your baby stay with you in your room (rooming in) can give you time to get to know your baby. Rooming in can also help you become comfortable caring for your baby. Breastfeeding can also help you bond with your baby. How can I plan for returning home with my baby?  Make sure that you have a car seat installed in your vehicle. ? Your car seat should be checked by a certified car seat installer to make sure that it is installed safely. ? Make sure that your baby fits into the car seat safely.  Ask your health care provider any questions you have about caring for yourself or your baby. Make sure that you are able to contact your health care provider with any questions after leaving the hospital. This information is not intended to replace advice given to you by your health care provider. Make sure you discuss any questions you have with your health care provider. Document Released: 01/26/2007 Document Revised: 09/03/2015 Document Reviewed: 03/05/2015 Elsevier Interactive Patient Education  2018 [employer]   Contraception Choices Contraception, also called birth control, means things to use or ways to try not to get pregnant. Hormonal birth control This kind of birth control uses hormones. Here are some types of hormonal birth control:  A tube that is put under skin of the arm (implant). The tube can stay in for as long as 3 years.  Shots to get every 3 months (injections).  Pills to take every day (birth control pills).  A patch to change 1 time each week for 3 weeks (birth control patch). After that, the patch is taken off for 1 week.  A ring to put in the vagina. The ring is left in for 3 weeks. Then it is taken out of the vagina for 1 week. Then a new ring is put in.  Pills to take after unprotected sex (emergency birth control pills).  Barrier birth  control Here are some types of barrier birth control:  A thin covering that is put on the penis before sex (female condom). The covering is thrown away after sex.  A soft, loose covering that is put in the vagina before sex (female condom). The covering is thrown away after sex.  A rubber bowl that sits over the cervix (diaphragm). The bowl must be made for you. The bowl is put into the vagina before sex. The bowl is left in for 6-8 hours after sex. It is taken out within 24 hours.  A small, soft cup that fits over the cervix (cervical cap). The cup must be made for you. The cup can be left in for 6-8 hours after sex. It is taken out within 48 hours.  A sponge that is put into the vagina before sex. It must be left in for at least 6  hours after sex. It must be taken out within 30 hours. Then it is thrown away.  A chemical that kills or stops sperm from getting into the uterus (spermicide). It may be a pill, cream, jelly, or foam to put in the vagina. The chemical should be used at least 10-15 minutes before sex.  IUD (intrauterine) birth control An IUD is a small, T-shaped piece of plastic. It is put inside the uterus. There are two kinds:  Hormone IUD. This kind can stay in for 3-5 years.  Copper IUD. This kind can stay in for 10 years.  Permanent birth control Here are some types of permanent birth control:  Surgery to block the fallopian tubes.  Having an insert put into each fallopian tube.  Surgery to tie off the tubes that carry sperm (vasectomy).  Natural planning birth control Here are some types of natural planning birth control:  Not having sex on the days the woman could get pregnant.  Using a calendar: ? To keep track of the length of each period. ? To find out what days pregnancy can happen. ? To plan to not have sex on days when pregnancy can happen.  Watching for symptoms of ovulation and not having sex during ovulation. One way the woman can check for ovulation  is to check her temperature.  Waiting to have sex until after ovulation.  Summary  Contraception, also called birth control, means things to use or ways to try not to get pregnant.  Hormonal methods of birth control include implants, injections, pills, patches, vaginal rings, and emergency birth control pills.  Barrier methods of birth control can include female condoms, female condoms, diaphragms, cervical caps, sponges, and spermicides.  There are two types of IUD (intrauterine device) birth control. An IUD can be put in a woman's uterus to prevent pregnancy for 3-5 years.  Permanent sterilization can be done through a procedure for males, females, or both.  Natural planning methods involve not having sex on the days when the woman could get pregnant. This information is not intended to replace advice given to you by your health care provider. Make sure you discuss any questions you have with your health care provider. Document Released: 01/26/2009 Document Revised: 04/10/2016 Document Reviewed: 04/10/2016 Elsevier Interactive Patient Education  2017 [employer].  Levonorgestrel intrauterine device (IUD) What is this medicine? LEVONORGESTREL IUD (LEE voe nor jes trel) is a contraceptive (birth control) device. The device is placed inside the uterus by a healthcare professional. It is used to prevent pregnancy. This device can also be used to treat heavy bleeding that occurs during your period. This medicine may be used for other purposes; ask your health care provider or pharmacist if you have questions. COMMON BRAND NAME(S): Cameron Ali What should I tell my health care provider before I take this medicine? They need to know if you have any of these conditions: -abnormal Pap smear -cancer of the breast, uterus, or cervix -diabetes -endometritis -genital or pelvic infection now or in the past -have more than one sexual partner or your partner has more than one  partner -heart disease -history of an ectopic or tubal pregnancy -immune system problems -IUD in place -liver disease or tumor -problems with blood clots or take blood-thinners -seizures -use intravenous drugs -uterus of unusual shape -vaginal bleeding that has not been explained -an unusual or allergic reaction to levonorgestrel, other hormones, silicone, or polyethylene, medicines, foods, dyes, or preservatives -pregnant or trying to get pregnant -breast-feeding  How should I use this medicine? This device is placed inside the uterus by a health care professional. Talk to your pediatrician regarding the use of this medicine in children. Special care may be needed. Overdosage: If you think you have taken too much of this medicine contact a poison control center or emergency room at once. NOTE: This medicine is only for you. Do not share this medicine with others. What if I miss a dose? This does not apply. Depending on the brand of device you have inserted, the device will need to be replaced every 3 to 5 years if you wish to continue using this type of birth control. What may interact with this medicine? Do not take this medicine with any of the following medications: -amprenavir -bosentan -fosamprenavir This medicine may also interact with the following medications: -aprepitant -armodafinil -barbiturate medicines for inducing sleep or treating seizures -bexarotene -boceprevir -griseofulvin -medicines to treat seizures like carbamazepine, ethotoin, felbamate, oxcarbazepine, phenytoin, topiramate -modafinil -pioglitazone -rifabutin -rifampin -rifapentine -some medicines to treat HIV infection like atazanavir, efavirenz, indinavir, lopinavir, nelfinavir, tipranavir, ritonavir -St. John's wort -warfarin This list may not describe all possible interactions. Give your health care provider a list of all the medicines, herbs, non-prescription drugs, or dietary supplements you use.  Also tell them if you smoke, drink alcohol, or use illegal drugs. Some items may interact with your medicine. What should I watch for while using this medicine? Visit your doctor or health care professional for regular check ups. See your doctor if you or your partner has sexual contact with others, becomes HIV positive, or gets a sexual transmitted disease. This product does not protect you against HIV infection (AIDS) or other sexually transmitted diseases. You can check the placement of the IUD yourself by reaching up to the top of your vagina with clean fingers to feel the threads. Do not pull on the threads. It is a good habit to check placement after each menstrual period. Call your doctor right away if you feel more of the IUD than just the threads or if you cannot feel the threads at all. The IUD may come out by itself. You may become pregnant if the device comes out. If you notice that the IUD has come out use a backup birth control method like condoms and call your health care provider. Using tampons will not change the position of the IUD and are okay to use during your period. This IUD can be safely scanned with magnetic resonance imaging (MRI) only under specific conditions. Before you have an MRI, tell your healthcare provider that you have an IUD in place, and which type of IUD you have in place. What side effects may I notice from receiving this medicine? Side effects that you should report to your doctor or health care professional as soon as possible: -allergic reactions like skin rash, itching or hives, swelling of the face, lips, or tongue -fever, flu-like symptoms -genital sores -high blood pressure -no menstrual period for 6 weeks during use -pain, swelling, warmth in the leg -pelvic pain or tenderness -severe or sudden headache -signs of pregnancy -stomach cramping -sudden shortness of breath -trouble with balance, talking, or walking -unusual vaginal bleeding,  discharge -yellowing of the eyes or skin Side effects that usually do not require medical attention (report to your doctor or health care professional if they continue or are bothersome): -acne -breast pain -change in sex drive or performance -changes in weight -cramping, dizziness, or faintness while the device is being  inserted -headache -irregular menstrual bleeding within first 3 to 6 months of use -nausea This list may not describe all possible side effects. Call your doctor for medical advice about side effects. You may report side effects to FDA at 1-800-FDA-1088. Where should I keep my medicine? This does not apply. NOTE: This sheet is a summary. It may not cover all possible information. If you have questions about this medicine, talk to your doctor, pharmacist, or health care provider.  2018 Elsevier/Gold Standard (2016-01-11 14:14:56)

## 2017-04-11 NOTE — Discharge Summary (Signed)
OB Discharge Summary     Patient Name: Beth Cordova DOB: 12/03/1985 MRN: [id number]  Date of admission: 04/09/2017 Delivering MD: Rolm BookbinderMOSS, AMBER   Date of discharge: 04/11/2017  Admitting diagnosis: 39.3 WEEKS CTX Intrauterine pregnancy: 5868w3d     Secondary diagnosis:  Principal Problem:   SVD (spontaneous vaginal delivery) Active Problems:   Group B Streptococcus carrier, +RV culture, currently pregnant   Normal labor     Discharge diagnosis: Term Pregnancy Delivered                                                                                                Post partum procedures:none  Augmentation: AROM  Complications: None  Hospital course:  Onset of Labor With Vaginal Delivery     [age] y.o. yo [id number] at 7068w3d was admitted in Active Labor on 04/09/2017. Patient had an uncomplicated labor course as follows:  Membrane Rupture Time/Date: 9:38 AM ,04/09/2017   Intrapartum Procedures: Episiotomy: None [1]                                         Lacerations:  None [1]  Patient had a delivery of a Viable infant. 04/09/2017  Information for the patient's newborn:  Beth Cordova, Beth Cordova [[id number]]  Delivery Method: Vag-Spont    Pateint had an uncomplicated postpartum course.  She is ambulating, tolerating a regular diet, passing flatus, and urinating well. Patient is discharged home in stable condition on 04/11/17.   Physical exam  Vitals:   04/10/17 0532 04/10/17 1300 04/10/17 2013 04/11/17 0620  BP: (!) 104/55  118/62 113/75  Pulse: 71  74 (!) 57  Resp: 16   18  Temp: 98 F (36.7 C)  98.2 F (36.8 C)   TempSrc: Oral  Oral   SpO2: 99%     Weight:  172 lb (78 kg)    Height:       General: alert, cooperative and no distress Lochia: appropriate Uterine Fundus: firm Incision: N/A DVT Evaluation: No evidence of DVT seen on physical exam. No significant calf/ankle edema. Labs: Lab Results  Component Value Date   WBC 9.6 04/09/2017   HGB 13.1 04/09/2017   HCT 37.4 04/09/2017   MCV 92.8 04/09/2017   PLT 215 04/09/2017   No flowsheet data found.  Discharge instruction: per After Visit Summary and "Baby and Me Booklet".  After visit meds:  Allergies as of 04/11/2017   No Known Allergies     Medication List    TAKE these medications   ibuprofen 600 MG tablet Commonly known as:  ADVIL,MOTRIN Take 1 tablet (600 mg total) by mouth every 6 (six) hours.   Prenatal Vitamins 28-0.8 MG Tabs Take 1 tablet by mouth daily.       Diet: routine diet  Activity: Advance as tolerated. Pelvic rest for 6 weeks.   Outpatient follow up: 4 week (message sent to admin pool)  Postpartum contraception: considering IUD  Newborn Data: Live born female  Birth Weight: 7 lb 6.3 oz (3354  g) APGAR: 8, 9  Newborn Delivery   Birth date/time:  04/09/2017 09:49:00 Delivery type:  Vaginal, Spontaneous     Baby Feeding: Breast Disposition:home with mother   04/11/2017 Frederik PearJulie P Mayola Mcbain, MD

## 2017-04-12 ENCOUNTER — Ambulatory Visit: Payer: Self-pay

## 2017-04-12 NOTE — Lactation Note (Signed)
This note was copied from a baby's chart. Lactation Consultation Note  Patient Name: Beth Cordova [id number]'s Date: 04/12/2017 Reason for consult: Follow-up assessment   Baby 74 hours old.  Mother has been pumping and giving baby breastmilk in bottle and is occasionally breastfeeding. Mother pumped approx 30 ml the last time she pumped. Baby sucking on Pacifier.  Pacifier use not recommended at this time.  Provided education. Encouraged mother to latch baby.  LC provided pillow under baby to bring to nipple height. Sucks and swallows observed. Reviewed engorgement care and monitoring voids/stools. Mom encouraged to feed baby 8-12 times/24 hours and with feeding cues.     Maternal Data    Feeding Feeding Type: Breast Fed  LATCH Score Latch: Grasps breast easily, tongue down, lips flanged, rhythmical sucking.  Audible Swallowing: Spontaneous and intermittent  Type of Nipple: Everted at rest and after stimulation  Comfort (Breast/Nipple): Soft / non-tender  Hold (Positioning): Assistance needed to correctly position infant at breast and maintain latch.  LATCH Score: 9  Interventions Interventions: Breast feeding basics reviewed  Lactation Tools Discussed/Used     Consult Status Consult Status: Complete    Hardie PulleyBerkelhammer, Ruth Boschen 04/12/2017, 12:33 PM

## 2017-05-11 ENCOUNTER — Ambulatory Visit: Payer: Medicaid Other | Admitting: Family Medicine

## 2017-05-12 ENCOUNTER — Other Ambulatory Visit (HOSPITAL_COMMUNITY)
Admission: RE | Admit: 2017-05-12 | Discharge: 2017-05-12 | Disposition: A | Discharge status: Home / Self Care | Payer: Medicaid Other | Source: Ambulatory Visit | Attending: Advanced Practice Midwife | Admitting: Advanced Practice Midwife

## 2017-05-12 ENCOUNTER — Ambulatory Visit (INDEPENDENT_AMBULATORY_CARE_PROVIDER_SITE_OTHER): Payer: Medicaid Other | Admitting: Advanced Practice Midwife

## 2017-05-12 ENCOUNTER — Encounter: Payer: Self-pay | Admitting: Advanced Practice Midwife

## 2017-05-12 ENCOUNTER — Encounter: Payer: Self-pay | Admitting: *Deleted

## 2017-05-12 DIAGNOSIS — N898 Other specified noninflammatory disorders of vagina: Secondary | ICD-10-CM | POA: Insufficient documentation

## 2017-05-12 DIAGNOSIS — Z30430 Encounter for insertion of intrauterine contraceptive device: Secondary | ICD-10-CM | POA: Diagnosis not present

## 2017-05-12 DIAGNOSIS — Z1389 Encounter for screening for other disorder: Secondary | ICD-10-CM | POA: Diagnosis not present

## 2017-05-12 DIAGNOSIS — Z3202 Encounter for pregnancy test, result negative: Secondary | ICD-10-CM | POA: Diagnosis not present

## 2017-05-12 DIAGNOSIS — Z3009 Encounter for other general counseling and advice on contraception: Secondary | ICD-10-CM

## 2017-05-12 LAB — POCT PREGNANCY, URINE: Preg Test, Ur: NEGATIVE

## 2017-05-12 MED ORDER — LEVONORGESTREL 19.5 MCG/DAY IU IUD
INTRAUTERINE_SYSTEM | Freq: Once | INTRAUTERINE | Status: AC
  Administered 2017-05-12: 1 via INTRAUTERINE

## 2017-05-12 NOTE — Patient Instructions (Signed)

## 2017-05-12 NOTE — Progress Notes (Signed)
Subjective:     Beth Cordova is a 32 y.o. female who presents for a postpartum visit. She is 4 weeks postpartum following a spontaneous vaginal delivery. I have fully reviewed the prenatal and intrapartum course. The delivery was at 39/3 gestational weeks. Outcome: spontaneous vaginal delivery. Anesthesia: epidural. Postpartum course has been uncomplicated. Baby's course has been uncomplicated. Baby is feeding by breast. Bleeding no bleeding. Bowel function is normal. Bladder function is normal. Patient is not sexually active. Contraception method is none. Postpartum depression screening: negative.  Patient is interested in IUD today. She had trichomoniasis in 02/2017, and was treated for this. She reports that she has not been sexually since she had treatment.   The following portions of the patient's history were reviewed and updated as appropriate: allergies, current medications, past family history, past medical history, past social history, past surgical history and problem list.  Review of Systems Pertinent items are noted in HPI.   Objective:    LMP 07/07/2016 (Exact Date)   General:  alert and cooperative   Breasts:  inspection negative, no nipple discharge or bleeding, no masses or nodularity palpable  Lungs: clear to auscultation bilaterally  Heart:  regular rate and rhythm  Abdomen: soft, non-tender; bowel sounds normal; no masses,  no organomegaly   Vulva:  normal  Vagina: normal vagina  Cervix:  multiparous appearance  Corpus: normal  Adnexa:  normal adnexa  Rectal Exam: Not performed.         GYNECOLOGY OFFICE PROCEDURE NOTE  Beth Cordova is a 32 y.o. [id number] here for Liletta IUD insertion. No GYN concerns.  pap smear done today IUD Insertion Procedure Note Patient identified, informed consent performed, consent signed.   Discussed risks of irregular bleeding, cramping, infection, malpositioning or misplacement of the IUD outside the uterus which may require  further procedure such as laparoscopy. Time out was performed.  Urine pregnancy test negative.  Speculum placed in the vagina.  Cervix visualized.  Cleaned with Betadine x 2.  Grasped anteriorly with a single tooth tenaculum.  Uterus sounded to 9 cm.  Liletta IUD placed per manufacturer's recommendations.  Strings trimmed to 3 cm. Tenaculum was removed, good hemostasis noted.  Patient tolerated procedure well.   Patient was given post-procedure instructions.  She was advised to have backup contraception for one week.  Patient was also asked to check IUD strings periodically and follow up in 4 weeks for IUD check.  Assessment:     Normal postpartum exam. Pap smear done at today's visit.   Plan:    1. Contraception: IUD 2 IUD insertions today 3. Follow up in: 1 month or as needed.

## 2017-05-13 LAB — CERVICOVAGINAL ANCILLARY ONLY
Bacterial vaginitis: NEGATIVE
Candida vaginitis: NEGATIVE
TRICH (WINDOWPATH): NEGATIVE

## 2017-05-15 LAB — CYTOLOGY - PAP
Candida vaginitis: NEGATIVE
Chlamydia: NEGATIVE
Diagnosis: NEGATIVE
HPV (WINDOPATH): NOT DETECTED
NEISSERIA GONORRHEA: NEGATIVE
TRICH (WINDOWPATH): NEGATIVE

## 2017-06-22 ENCOUNTER — Ambulatory Visit: Payer: Medicaid Other | Admitting: Advanced Practice Midwife

## 2017-07-17 ENCOUNTER — Telehealth: Payer: Self-pay | Admitting: Family Medicine

## 2017-07-17 NOTE — Telephone Encounter (Signed)
Patient is requesting a call back at [phone] to speak to someone about possibly getting her IUD removed. She can't get her weight down, and thinks this may be causing the problem.

## 2017-08-04 NOTE — Telephone Encounter (Signed)
Patient is scheduled with Thressa ShellerHeather Hogan on 4/30 for IUD check.

## 2017-08-11 ENCOUNTER — Encounter: Payer: Self-pay | Admitting: Advanced Practice Midwife

## 2017-08-11 ENCOUNTER — Ambulatory Visit (INDEPENDENT_AMBULATORY_CARE_PROVIDER_SITE_OTHER): Payer: Medicaid Other | Admitting: Advanced Practice Midwife

## 2017-08-11 VITALS — BP 106/67 | HR 57

## 2017-08-11 DIAGNOSIS — Z3009 Encounter for other general counseling and advice on contraception: Secondary | ICD-10-CM | POA: Diagnosis not present

## 2017-08-11 NOTE — Patient Instructions (Signed)
Use My Fitness Pal app for tracking food intake Exercise 20-30 mins per day

## 2017-08-11 NOTE — Progress Notes (Signed)
GYNECOLOGY OFFICE PROGRESS NOTE  History:  32 y.o. [id number] here today for today for IUD string check; Liletta IUD was placed  05/12/17. No complaints about the IUD, She is worried because she has not been able to lose weight as fast as with her other children. She is not currently following an exercise of diet plan.   The following portions of the patient's history were reviewed and updated as appropriate: allergies, current medications, past family history, past medical history, past social history, past surgical history and problem list. Last pap smear on 05/11/17 was normal, Neg HRHPV.  Review of Systems:  Pertinent items are noted in HPI.   Objective:  Physical Exam Blood pressure 106/67, pulse (!) 57, currently breastfeeding. CONSTITUTIONAL: Well-developed, well-nourished female in no acute distress.  HENT:  Normocephalic, atraumatic. External right and left ear normal. Oropharynx is clear and moist EYES: Conjunctivae and EOM are normal. Pupils are equal, round, and reactive to light. No scleral icterus.  NECK: Normal range of motion, supple, no masses CARDIOVASCULAR: Normal heart rate noted RESPIRATORY: Effort and breath sounds normal, no problems with respiration noted ABDOMEN: Soft, no distention noted.   PELVIC: Normal appearing external genitalia; normal appearing vaginal mucosa and cervix.  IUD strings visualized, about 3 cm in length outside cervix.   Assessment & Plan:  Normal IUD check. Patient to keep IUD in place for 7 years; can come in for removal if she desires pregnancy within the next 7 years. Routine preventative health maintenance measures emphasized. Discussed my fitness pal for dietary tracking 20-30 mins of exercise daily.   Thressa Sheller 5:26 PM 08/11/17

## 2018-10-20 DIAGNOSIS — B182 Chronic viral hepatitis C: Secondary | ICD-10-CM | POA: Insufficient documentation

## 2019-10-28 DIAGNOSIS — Z23 Encounter for immunization: Secondary | ICD-10-CM | POA: Diagnosis not present

## 2019-10-28 DIAGNOSIS — B182 Chronic viral hepatitis C: Secondary | ICD-10-CM | POA: Diagnosis not present

## 2020-01-17 ENCOUNTER — Ambulatory Visit (INDEPENDENT_AMBULATORY_CARE_PROVIDER_SITE_OTHER): Payer: BLUE CROSS/BLUE SHIELD | Admitting: *Deleted

## 2020-01-17 ENCOUNTER — Other Ambulatory Visit: Payer: Self-pay

## 2020-01-17 VITALS — BP 100/58 | HR 61 | Temp 97.7°F | Ht 63.0 in | Wt 163.8 lb

## 2020-01-17 DIAGNOSIS — Z8632 Personal history of gestational diabetes: Secondary | ICD-10-CM | POA: Insufficient documentation

## 2020-01-17 DIAGNOSIS — O34219 Maternal care for unspecified type scar from previous cesarean delivery: Secondary | ICD-10-CM

## 2020-01-17 DIAGNOSIS — Z3201 Encounter for pregnancy test, result positive: Secondary | ICD-10-CM | POA: Diagnosis not present

## 2020-01-17 DIAGNOSIS — O09299 Supervision of pregnancy with other poor reproductive or obstetric history, unspecified trimester: Secondary | ICD-10-CM | POA: Insufficient documentation

## 2020-01-17 DIAGNOSIS — O09292 Supervision of pregnancy with other poor reproductive or obstetric history, second trimester: Secondary | ICD-10-CM

## 2020-01-17 DIAGNOSIS — Z98891 History of uterine scar from previous surgery: Secondary | ICD-10-CM | POA: Insufficient documentation

## 2020-01-17 DIAGNOSIS — O0930 Supervision of pregnancy with insufficient antenatal care, unspecified trimester: Secondary | ICD-10-CM | POA: Insufficient documentation

## 2020-01-17 DIAGNOSIS — O0990 Supervision of high risk pregnancy, unspecified, unspecified trimester: Secondary | ICD-10-CM | POA: Diagnosis not present

## 2020-01-17 LAB — POCT URINE PREGNANCY: Preg Test, Ur: POSITIVE — AB

## 2020-01-17 MED ORDER — GOJJI WEIGHT SCALE MISC: 1.0000 | Freq: Every day | 0 refills | Status: DC | PRN

## 2020-01-17 MED ORDER — PRENATAL PLUS 27-1 MG PO TABS: 1.0000 | ORAL_TABLET | Freq: Every day | ORAL | 0 refills | Status: DC

## 2020-01-17 MED ORDER — BLOOD PRESSURE MONITOR AUTOMAT DEVI: 1.0000 | Freq: Every day | 0 refills | Status: DC

## 2020-01-17 NOTE — Progress Notes (Signed)
   PRENATAL INTAKE SUMMARY  Beth Cordova presents today New OB Nurse Interview.  OB History    Gravida  7   Para  6   Term  5   Preterm  1   AB  0   Living  5     SAB  0   TAB      Ectopic      Multiple  0   Live Births  6          I have reviewed the patient's medical, obstetrical, social, and family histories, medications, and available lab results.  SUBJECTIVE She has no unusual complaints. History of fetal demise and c-section. Please see Care Everywhere. Cesarean delivery due to Breech presentation. History of Hep C and GDM.  OBJECTIVE Initial nurse interview for history and labs (New OB)  EDD: 07/04/2020 GA: [redacted]w[redacted]d G7P5105  GENERAL APPEARANCE: alert, well appearing, in no apparent distress, oriented to person, place and time   ASSESSMENT Positive UPT Normal pregnancy  PLAN Prenatal care-[facility] Pnl/HIV  OB Urine Culture GC/CT-To be completed at next visit with Beth Cordova, CNM 02/09/20 HgbEval/SMA/CF ([facility]) Pan[facility] AFP A1C Rx for BP monitor and weight scale sent to [facility] Rx for PNV sent to pharmacy [facility] signed for prenatal records for fetal demise.  Clovis Pu, RN

## 2020-01-19 LAB — URINE CULTURE, OB REFLEX

## 2020-01-19 LAB — AFP, SERUM, OPEN SPINA BIFIDA
AFP MoM: 1.12
AFP Value: 31.4 ng/mL
Gest. Age on Collection Date: 15 weeks
Maternal Age At EDD: 34.9 yr
OSBR Risk 1 IN: 8371
Test Results:: NEGATIVE
Weight: 163 [lb_av]

## 2020-01-19 LAB — HEMOGLOBIN A1C
Est. average glucose Bld gHb Est-mCnc: 100 mg/dL
Hgb A1c MFr Bld: 5.1 % (ref 4.8–5.6)

## 2020-01-19 LAB — CULTURE, OB URINE

## 2020-01-20 LAB — CBC/D/PLT+RPR+RH+ABO+RUB AB...

## 2020-01-22 LAB — CBC/D/PLT+RPR+RH+ABO+RUB AB...
Antibody Screen: NEGATIVE
Basophils Absolute: 0 10*3/uL (ref 0.0–0.2)
Basos: 0 %
EOS (ABSOLUTE): 0.1 10*3/uL (ref 0.0–0.4)
Eos: 2 %
HCV Ab: >11 s/co ratio — ABNORMAL HIGH (ref 0.0–0.9)
HIV Screen 4th Generation wRfx: NONREACTIVE
Hematocrit: 38 % (ref 34.0–46.6)
Hemoglobin: 13.4 g/dL (ref 11.1–15.9)
Hepatitis B Surface Ag: NEGATIVE
Immature Grans (Abs): 0 10*3/uL (ref 0.0–0.1)
Immature Granulocytes: 0 %
Lymphs: 28 %
MCH: 34 pg — ABNORMAL HIGH (ref 26.6–33.0)
MCHC: 35.3 g/dL (ref 31.5–35.7)
Monocytes Absolute: 0.3 10*3/uL (ref 0.1–0.9)
Monocytes: 4 %
Neutrophils Absolute: 4.5 10*3/uL (ref 1.4–7.0)
Neutrophils: 66 %
Platelets: 209 10*3/uL (ref 150–450)
RBC: 3.94 x10E6/uL (ref 3.77–5.28)
RDW: 12.3 % (ref 11.7–15.4)
RPR Ser Ql: NONREACTIVE
Rh Factor: POSITIVE
Rubella Antibodies, IGG: 3.02 index (ref >0.99)
WBC: 6.8 10*3/uL (ref 3.4–10.8)

## 2020-01-22 LAB — HCV RNA NAA QUALITATIVE: HCV RNA NAA QUALITATIVE: NEGATIVE

## 2020-01-22 LAB — INTERPRETATION

## 2020-01-24 ENCOUNTER — Encounter: Payer: Self-pay | Admitting: General Practice

## 2020-01-26 ENCOUNTER — Encounter: Payer: Self-pay | Admitting: General Practice

## 2020-01-30 ENCOUNTER — Encounter: Payer: Self-pay | Admitting: General Practice

## 2020-02-09 ENCOUNTER — Ambulatory Visit (INDEPENDENT_AMBULATORY_CARE_PROVIDER_SITE_OTHER): Payer: Medicaid Other | Admitting: Obstetrics and Gynecology

## 2020-02-09 ENCOUNTER — Other Ambulatory Visit (HOSPITAL_COMMUNITY)
Admission: RE | Admit: 2020-02-09 | Discharge: 2020-02-09 | Disposition: A | Discharge status: Home / Self Care | Payer: BLUE CROSS/BLUE SHIELD | Source: Ambulatory Visit | Attending: Obstetrics and Gynecology | Admitting: Obstetrics and Gynecology

## 2020-02-09 ENCOUNTER — Other Ambulatory Visit: Payer: Self-pay

## 2020-02-09 VITALS — BP 95/63 | HR 66 | Temp 98.5°F | Wt 168.6 lb

## 2020-02-09 DIAGNOSIS — Z3A19 19 weeks gestation of pregnancy: Secondary | ICD-10-CM | POA: Insufficient documentation

## 2020-02-09 DIAGNOSIS — O34219 Maternal care for unspecified type scar from previous cesarean delivery: Secondary | ICD-10-CM

## 2020-02-09 DIAGNOSIS — O0990 Supervision of high risk pregnancy, unspecified, unspecified trimester: Secondary | ICD-10-CM | POA: Insufficient documentation

## 2020-02-09 DIAGNOSIS — O0992 Supervision of high risk pregnancy, unspecified, second trimester: Secondary | ICD-10-CM | POA: Diagnosis not present

## 2020-02-09 DIAGNOSIS — Z641 Problems related to multiparity: Secondary | ICD-10-CM

## 2020-02-09 NOTE — Progress Notes (Signed)
INITIAL OBSTETRICAL VISIT Patient name: Beth Cordova MRN [id number]  Date of birth: 04/16/1985 Chief Complaint:   Initial Prenatal Visit  History of Present Illness:   Beth Cordova is a 34 y.o. G57P5105 Caucasian female at [redacted]w[redacted]d by LMP with an Estimated Date of Delivery: 07/04/20 being seen today for her initial obstetrical visit.  Her obstetrical history is significant for multigravida, chronic Hep C, preterm demise @ 20 wks, previous cesarean delivery. Pg #1 SVD @ 39 wks with no complications (2005), Pg #2 SVD @ 39 wks with no complications (2006), Pg #3 SVD @ 37 wks with no complications (2014), Pg #4 SVD @ 39+ wks with no complications (2018), Pg #5 Primary C/S for breech presentation @ 39 wks with no complications (2020). Pelvis proven to 7 lbs 6 oz (2018).  This is a planned pregnancy. She and the father of the baby (FOB) "Richard" live together with their children. She has a support system that consists of her husband/family/friends. Today she reports no complaints. She lives in [place] and desires to transfer to our [facility] location.  Patient's last menstrual period was 09/28/2019 (exact date). Last pap 1/292019. Results were: normal Review of Systems:   Pertinent items are noted in HPI Denies cramping/contractions, leakage of fluid, vaginal bleeding, abnormal vaginal discharge w/ itching/odor/irritation, headaches, visual changes, shortness of breath, chest pain, abdominal pain, severe nausea/vomiting, or problems with urination or bowel movements unless otherwise stated above.  Pertinent History Reviewed:  Reviewed past medical,surgical, social, obstetrical and family history.  Reviewed problem list, medications and allergies. OB History  Gravida Para Term Preterm AB Living  7 6 5 1  0 5  SAB TAB Ectopic Multiple Live Births  0     0 6    # Outcome Date GA Lbr Len/2nd Weight Sex Delivery Anes PTL Lv  7 Current           6 Term 12/07/18 [redacted]w[redacted]d  7 lb 9 oz (3.43  kg) M CS-LTranv  N LIV  5 Term 04/09/17 [redacted]w[redacted]d 05:38 / 00:11 7 lb 6.3 oz (3.354 kg) F Vag-Spont EPI  LIV  4 Preterm 04/09/15 [redacted]w[redacted]d  8 oz (0.227 kg) F Vag-Spont  Y DEC  3 Term 04/24/12 [redacted]w[redacted]d  5 lb 13 oz (2.637 kg) F Vag-Spont EPI  LIV  2 Term 04/01/05 [redacted]w[redacted]d  6 lb 12 oz (3.062 kg) F Vag-Spont EPI N LIV  1 Term 04/08/04 [redacted]w[redacted]d  6 lb 5 oz (2.863 kg) F Vag-Spont EPI N LIV   Physical Assessment:   Vitals:   02/09/20 1541  BP: 95/63  Pulse: 66  Temp: 98.5 F (36.9 C)  Weight: 168 lb 9.6 oz (76.5 kg)  Body mass index is 29.87 kg/m.       Physical Examination:  General appearance - well appearing, and in no distress  Mental status - alert, oriented to person, place, and time  Psych:  She has a normal mood and affect  Skin - warm and dry, normal color, no suspicious lesions noted  Chest - effort normal, all lung fields clear to auscultation bilaterally  Heart - normal rate and regular rhythm  Abdomen - soft, nontender  Extremities:  No swelling or varicosities noted  Pelvic - VULVA: normal appearing vulva with no masses, tenderness or lesions  VAGINA: normal appearing vagina with normal color and discharge, no lesions.   CERVIX: normal appearing cervix without discharge or lesions, no CMT  Thin prep pap is not done    FHTs by doppler: 146  bpm  Assessment & Plan:  1) Low-Risk Pregnancy [id number] at [redacted]w[redacted]d with an Estimated Date of Delivery: 07/04/20   2) Initial OB visit - Welcomed to practice and introduced self to patient in addition to discussing other advanced practice providers that she may be seeing at this practice - Congratulated patient - Anticipatory guidance on upcoming appointments - Educated on COVID19 and pregnancy and the integration of virtual appointments  - Educated on babyscripts app- patient reports she has not received email, encouraged to look in spam folder and to call office if she still has not received email - patient verbalizes understanding    3) Supervision of  high risk pregnancy, antepartum - Cervicovaginal ancillary only( [facility])   4) Grand multipara  5) [redacted] weeks gestation of pregnancy  6) Previous cesarean delivery affecting pregnancy - Planning TOLAC  Meds: No orders of the defined types were placed in this encounter.   Initial labs obtained Continue prenatal vitamins Reviewed n/v relief measures and warning s/s to report Reviewed recommended weight gain based on pre-gravid BMI Encouraged well-balanced diet Genetic Screening discussed: ordered Cystic fibrosis, SMA, Fragile X screening discussed ordered The nature of [facility] with multiple MDs and other Advanced Practice Providers was explained to patient; also emphasized that residents, students are part of our team.  Discussed optimized OB schedule and video visits. Advised can have an in-office visit whenever she feels she needs to be seen.  Does not have own BP cuff. BP cuff Rx faxed today. Explained to patient that BP will be mailed to her house. Check BP weekly, let us know if >140/90. Advised to call during normal business hours and there is an after-hours nurse line available.    Follow-up: Return in about 4 weeks (around 03/08/2020) for Return OB visit. In [place]  No orders of the defined types were placed in this encounter.   Raelyn Mora MSN, CNM 02/09/2020

## 2020-02-10 LAB — CERVICOVAGINAL ANCILLARY ONLY
Bacterial Vaginitis (gardnerella): NEGATIVE
Candida Glabrata: NEGATIVE
Candida Vaginitis: NEGATIVE
Chlamydia: NEGATIVE
Comment: NEGATIVE
Comment: NEGATIVE
Comment: NEGATIVE
Comment: NEGATIVE
Comment: NEGATIVE
Comment: NORMAL
Neisseria Gonorrhea: NEGATIVE
Trichomonas: NEGATIVE

## 2020-02-11 ENCOUNTER — Encounter: Payer: Self-pay | Admitting: Obstetrics and Gynecology

## 2020-02-11 DIAGNOSIS — Z641 Problems related to multiparity: Secondary | ICD-10-CM | POA: Insufficient documentation

## 2020-03-05 ENCOUNTER — Other Ambulatory Visit: Payer: Self-pay

## 2020-03-05 ENCOUNTER — Encounter: Payer: Self-pay | Admitting: Obstetrics & Gynecology

## 2020-03-05 ENCOUNTER — Ambulatory Visit (INDEPENDENT_AMBULATORY_CARE_PROVIDER_SITE_OTHER): Payer: BLUE CROSS/BLUE SHIELD | Admitting: Obstetrics & Gynecology

## 2020-03-05 VITALS — BP 100/61 | HR 75 | Wt 172.0 lb

## 2020-03-05 DIAGNOSIS — Z3A22 22 weeks gestation of pregnancy: Secondary | ICD-10-CM

## 2020-03-05 DIAGNOSIS — O34219 Maternal care for unspecified type scar from previous cesarean delivery: Secondary | ICD-10-CM

## 2020-03-05 DIAGNOSIS — Z641 Problems related to multiparity: Secondary | ICD-10-CM

## 2020-03-05 DIAGNOSIS — O0990 Supervision of high risk pregnancy, unspecified, unspecified trimester: Secondary | ICD-10-CM | POA: Diagnosis not present

## 2020-03-05 LAB — GLUCOSE, POCT (MANUAL RESULT ENTRY): POC Glucose: 135 mg/dl — AB (ref 70–99)

## 2020-03-05 NOTE — Progress Notes (Signed)
   PRENATAL VISIT NOTE  Subjective:  Beth Cordova is a 34 y.o. G7P5105 at [redacted]w[redacted]d being seen today for ongoing prenatal care.  She is currently monitored for the following issues for this high-risk pregnancy and has Supervision of high risk pregnancy, antepartum; Previous cesarean delivery affecting pregnancy; History of gestational diabetes mellitus (GDM); Late prenatal care affecting pregnancy, antepartum; Prior pregnancy with fetal demise and current pregnancy; and Grand multipara on their problem list.  Patient reports back and pelvic pressure.  Contractions: Not present. Vag. Bleeding: None.  Movement: Present. Denies leaking of fluid.   The following portions of the patient's history were reviewed and updated as appropriate: allergies, current medications, past family history, past medical history, past social history, past surgical history and problem list.   Objective:   Vitals:   03/05/20 1413  BP: 100/61  Pulse: 75  Weight: 172 lb (78 kg)    Fetal Status: Fetal Heart Rate (bpm): 136   Movement: Present     General:  Alert, oriented and cooperative. Patient is in no acute distress.  Skin: Skin is warm and dry. No rash noted.   Cardiovascular: Normal heart rate noted  Respiratory: Normal respiratory effort, no problems with respiration noted  Abdomen: Soft, gravid, appropriate for gestational age.  Pain/Pressure: Absent     Pelvic: Cervical exam performed in the presence of a chaperone      closed long  Extremities: Normal range of motion.  Edema: None  Mental Status: Normal mood and affect. Normal behavior. Normal judgment and thought content.   Assessment and Plan:  Pregnancy: G7P5105 at [redacted]w[redacted]d 1. [redacted] weeks gestation of pregnancy Pt has Hx of GDM.  CBG nml today after full breakfast (135 one hour pp)  2. Supervision of high risk pregnancy, antepartum - [place] MFM OB DETAIL +14 WK; Future - POCT Glucose (CBG)  3.  HCV Negative Ab after treatment Preterm labor symptoms  and general obstetric precautions including but not limited to vaginal bleeding, contractions, leaking of fluid and fetal movement were reviewed in detail with the patient. Please refer to After Visit Summary for other counseling recommendations.   RTC 4 weeks  No future appointments.  Elsie Lincoln, MD

## 2020-03-22 ENCOUNTER — Ambulatory Visit: Payer: BLUE CROSS/BLUE SHIELD | Attending: Obstetrics & Gynecology

## 2020-03-22 ENCOUNTER — Ambulatory Visit: Payer: BLUE CROSS/BLUE SHIELD | Admitting: *Deleted

## 2020-03-22 ENCOUNTER — Other Ambulatory Visit: Payer: Self-pay | Admitting: *Deleted

## 2020-03-22 ENCOUNTER — Encounter: Payer: Self-pay | Admitting: *Deleted

## 2020-03-22 ENCOUNTER — Other Ambulatory Visit: Payer: Self-pay

## 2020-03-22 DIAGNOSIS — O34219 Maternal care for unspecified type scar from previous cesarean delivery: Secondary | ICD-10-CM | POA: Diagnosis not present

## 2020-03-22 DIAGNOSIS — O0990 Supervision of high risk pregnancy, unspecified, unspecified trimester: Secondary | ICD-10-CM | POA: Diagnosis not present

## 2020-03-22 DIAGNOSIS — Z8632 Personal history of gestational diabetes: Secondary | ICD-10-CM | POA: Diagnosis not present

## 2020-03-22 DIAGNOSIS — O24410 Gestational diabetes mellitus in pregnancy, diet controlled: Secondary | ICD-10-CM

## 2020-03-22 DIAGNOSIS — O0930 Supervision of pregnancy with insufficient antenatal care, unspecified trimester: Secondary | ICD-10-CM

## 2020-04-05 ENCOUNTER — Ambulatory Visit (INDEPENDENT_AMBULATORY_CARE_PROVIDER_SITE_OTHER): Payer: Medicaid Other | Admitting: Obstetrics and Gynecology

## 2020-04-05 ENCOUNTER — Encounter: Payer: Self-pay | Admitting: Obstetrics and Gynecology

## 2020-04-05 ENCOUNTER — Other Ambulatory Visit: Payer: Self-pay

## 2020-04-05 VITALS — BP 107/72 | HR 75 | Wt 175.0 lb

## 2020-04-05 DIAGNOSIS — Z3A25 25 weeks gestation of pregnancy: Secondary | ICD-10-CM | POA: Diagnosis not present

## 2020-04-05 DIAGNOSIS — O34219 Maternal care for unspecified type scar from previous cesarean delivery: Secondary | ICD-10-CM

## 2020-04-05 DIAGNOSIS — Z641 Problems related to multiparity: Secondary | ICD-10-CM

## 2020-04-05 DIAGNOSIS — R768 Other specified abnormal immunological findings in serum: Secondary | ICD-10-CM

## 2020-04-05 DIAGNOSIS — O09292 Supervision of pregnancy with other poor reproductive or obstetric history, second trimester: Secondary | ICD-10-CM

## 2020-04-05 DIAGNOSIS — Z3009 Encounter for other general counseling and advice on contraception: Secondary | ICD-10-CM | POA: Insufficient documentation

## 2020-04-05 DIAGNOSIS — Z8632 Personal history of gestational diabetes: Secondary | ICD-10-CM

## 2020-04-05 DIAGNOSIS — O0990 Supervision of high risk pregnancy, unspecified, unspecified trimester: Secondary | ICD-10-CM | POA: Diagnosis not present

## 2020-04-05 NOTE — Progress Notes (Signed)
   PRENATAL VISIT NOTE  Subjective:  Beth Cordova is a 34 y.o. G7P5105 at [redacted]w[redacted]d being seen today for ongoing prenatal care.  She is currently monitored for the following issues for this high-risk pregnancy and has Supervision of high risk pregnancy, antepartum; Previous cesarean delivery affecting pregnancy; History of gestational diabetes mellitus (GDM); Late prenatal care affecting pregnancy, antepartum; Prior pregnancy with fetal demise and current pregnancy; Grand multipara; Hepatitis C antibody positive in blood; and Unwanted fertility on their problem list.  Patient reports occasional umbilical hernia pain.  Contractions: Not present. Vag. Bleeding: None.  Movement: Present. Denies leaking of fluid.   The following portions of the patient's history were reviewed and updated as appropriate: allergies, current medications, past family history, past medical history, past social history, past surgical history and problem list.   Objective:   Vitals:   04/05/20 0855  BP: 107/72  Pulse: 75  Weight: 175 lb (79.4 kg)    Fetal Status: Fetal Heart Rate (bpm): 143   Movement: Present     General:  Alert, oriented and cooperative. Patient is in no acute distress.  Skin: Skin is warm and dry. No rash noted.   Cardiovascular: Normal heart rate noted  Respiratory: Normal respiratory effort, no problems with respiration noted  Abdomen: Soft, gravid, appropriate for gestational age.  Pain/Pressure: Absent     Pelvic: Cervical exam deferred        Extremities: Normal range of motion.  Edema: None  Mental Status: Normal mood and affect. Normal behavior. Normal judgment and thought content.   Assessment and Plan:  Pregnancy: G7P5105 at [redacted]w[redacted]d  1. [redacted] weeks gestation of pregnancy - 2Hr GTT w/ 1 Hr Carpenter 75 g - HIV antibody (with reflex) - CBC - RPR  2. Supervision of high risk pregnancy, antepartum  3. Previous cesarean delivery affecting pregnancy Reviewed risks/benefits of TOLAC  versus RCS in detail. Patient counseled regarding potential vaginal delivery, chance of success, future implications, possible uterine rupture and need for urgent/emergent repeat cesarean. Counseled regarding potential need for repeat c-section for reasons unrelated to first c-section. Counseled regarding scheduled repeat cesarean including risks of bleeding, infection, damage to surrounding tissue, abnormal placentation, implications for future pregnancies. All questions answered.  Patient desires TOLAC, consent signed 04/05/2020.  4. History of gestational diabetes mellitus (GDM) Did not know she had diabetes  5. Prior pregnancy with fetal demise and current pregnancy in second trimester  6. Grand multipara  7. Hepatitis C antibody positive in blood Neg RNA quant  8. Unwanted fertility BTL papers singed today  Preterm labor symptoms and general obstetric precautions including but not limited to vaginal bleeding, contractions, leaking of fluid and fetal movement were reviewed in detail with the patient. Please refer to After Visit Summary for other counseling recommendations.   Return in about 2 weeks (around 04/19/2020) for high OB, in person.  Future Appointments  Date Time Provider Department Center  04/20/2020  9:45 AM [facility] NURSE [facility]  04/20/2020 10:00 AM [facility] US1 [facility]    Conan Bowens, MD

## 2020-04-05 NOTE — Progress Notes (Signed)
Pt c/o hernia pain

## 2020-04-09 LAB — HIV ANTIBODY (ROUTINE TESTING W REFLEX): HIV 1&2 Ab, 4th Generation: NONREACTIVE

## 2020-04-09 LAB — CBC
HCT: 36.9 % (ref 35.0–45.0)
Hemoglobin: 13 g/dL (ref 11.7–15.5)
MCH: 33.9 pg — ABNORMAL HIGH (ref 27.0–33.0)
MCHC: 35.2 g/dL (ref 32.0–36.0)
MCV: 96.1 fL (ref 80.0–100.0)
MPV: 12.1 fL (ref 7.5–12.5)
Platelets: 220 10*3/uL (ref 140–400)
RBC: 3.84 10*6/uL (ref 3.80–5.10)
RDW: 11.8 % (ref 11.0–15.0)
WBC: 6.5 10*3/uL (ref 3.8–10.8)

## 2020-04-09 LAB — RPR: RPR Ser Ql: NONREACTIVE

## 2020-04-09 LAB — 2HR GTT W 1 HR, CARPENTER, 75 G
Glucose, 1 Hr, Gest: 131 mg/dL (ref 65–179)
Glucose, 2 Hr, Gest: 64 mg/dL — ABNORMAL LOW (ref 65–152)
Glucose, Fasting, Gest: 74 mg/dL (ref 65–91)

## 2020-04-19 ENCOUNTER — Encounter: Payer: Medicaid Other | Admitting: Obstetrics and Gynecology

## 2020-04-20 ENCOUNTER — Ambulatory Visit: Payer: BLUE CROSS/BLUE SHIELD | Admitting: *Deleted

## 2020-04-20 ENCOUNTER — Encounter: Payer: Self-pay | Admitting: *Deleted

## 2020-04-20 ENCOUNTER — Other Ambulatory Visit: Payer: Self-pay

## 2020-04-20 ENCOUNTER — Ambulatory Visit: Payer: BLUE CROSS/BLUE SHIELD | Attending: Obstetrics and Gynecology

## 2020-04-20 DIAGNOSIS — O98412 Viral hepatitis complicating pregnancy, second trimester: Secondary | ICD-10-CM | POA: Diagnosis not present

## 2020-04-20 DIAGNOSIS — Z8632 Personal history of gestational diabetes: Secondary | ICD-10-CM

## 2020-04-20 DIAGNOSIS — O09292 Supervision of pregnancy with other poor reproductive or obstetric history, second trimester: Secondary | ICD-10-CM

## 2020-04-20 DIAGNOSIS — O09212 Supervision of pregnancy with history of pre-term labor, second trimester: Secondary | ICD-10-CM

## 2020-04-20 DIAGNOSIS — O24410 Gestational diabetes mellitus in pregnancy, diet controlled: Secondary | ICD-10-CM | POA: Diagnosis not present

## 2020-04-20 DIAGNOSIS — O0930 Supervision of pregnancy with insufficient antenatal care, unspecified trimester: Secondary | ICD-10-CM | POA: Diagnosis not present

## 2020-04-20 DIAGNOSIS — O0990 Supervision of high risk pregnancy, unspecified, unspecified trimester: Secondary | ICD-10-CM

## 2020-04-20 DIAGNOSIS — O0942 Supervision of pregnancy with grand multiparity, second trimester: Secondary | ICD-10-CM

## 2020-04-20 DIAGNOSIS — O34219 Maternal care for unspecified type scar from previous cesarean delivery: Secondary | ICD-10-CM | POA: Insufficient documentation

## 2020-04-20 DIAGNOSIS — Z3A27 27 weeks gestation of pregnancy: Secondary | ICD-10-CM

## 2020-04-20 DIAGNOSIS — B182 Chronic viral hepatitis C: Secondary | ICD-10-CM

## 2020-04-26 ENCOUNTER — Other Ambulatory Visit: Payer: Self-pay

## 2020-04-26 ENCOUNTER — Ambulatory Visit (INDEPENDENT_AMBULATORY_CARE_PROVIDER_SITE_OTHER): Payer: BLUE CROSS/BLUE SHIELD | Admitting: Obstetrics & Gynecology

## 2020-04-26 VITALS — BP 100/61 | HR 66 | Wt 178.0 lb

## 2020-04-26 DIAGNOSIS — R768 Other specified abnormal immunological findings in serum: Secondary | ICD-10-CM

## 2020-04-26 DIAGNOSIS — Z8632 Personal history of gestational diabetes: Secondary | ICD-10-CM

## 2020-04-26 DIAGNOSIS — Z641 Problems related to multiparity: Secondary | ICD-10-CM

## 2020-04-26 DIAGNOSIS — O34219 Maternal care for unspecified type scar from previous cesarean delivery: Secondary | ICD-10-CM

## 2020-04-26 DIAGNOSIS — O0990 Supervision of high risk pregnancy, unspecified, unspecified trimester: Secondary | ICD-10-CM

## 2020-04-26 DIAGNOSIS — Z3A28 28 weeks gestation of pregnancy: Secondary | ICD-10-CM

## 2020-04-26 NOTE — Progress Notes (Signed)
   PRENATAL VISIT NOTE  Subjective:  Beth Cordova is a 35 y.o. G7P5105 at [redacted]w[redacted]d being seen today for ongoing prenatal care.  She is currently monitored for the following issues for this high-risk pregnancy and has Supervision of high risk pregnancy, antepartum; Previous cesarean delivery affecting pregnancy; History of gestational diabetes mellitus (GDM); Late prenatal care affecting pregnancy, antepartum; Prior pregnancy with fetal demise and current pregnancy; Grand multipara; Hepatitis C antibody positive in blood; Unwanted fertility; and Chronic hepatitis C virus infection (HCC) on their problem list.  Patient reports no complaints.  Contractions: Irritability. Vag. Bleeding: None.  Movement: Present. Denies leaking of fluid. Reviewed 28 week lab work with pt.    The following portions of the patient's history were reviewed and updated as appropriate: allergies, current medications, past family history, past medical history, past social history, past surgical history and problem list.   Objective:   Vitals:   04/26/20 1524  BP: 100/61  Pulse: 66  Weight: 178 lb (80.7 kg)    Fetal Status: Fetal Heart Rate (bpm): 131 Fundal Height: 28 cm Movement: Present     General:  Alert, oriented and cooperative. Patient is in no acute distress.  Skin: Skin is warm and dry. No rash noted.   Cardiovascular: Normal heart rate noted  Respiratory: Normal respiratory effort, no problems with respiration noted  Abdomen: Soft, gravid, appropriate for gestational age.  Pain/Pressure: Absent     Pelvic: Cervical exam deferred        Extremities: Normal range of motion.  Edema: None  Mental Status: Normal mood and affect. Normal behavior. Normal judgment and thought content.   Assessment and Plan:  Pregnancy: G7P5105 at [redacted]w[redacted]d 1. [redacted] weeks gestation of pregnancy - on PNV  2. Hepatitis C antibody positive in blood - negative Hep C RNA quant 01/22/2020  3. Supervision of high risk pregnancy,  antepartum  4. History of gestational diabetes mellitus (GDM) - normal 2 hr GTT this pregnancy  5. Grand multipara - BTL papers signed 04/05/2020  6. Previous cesarean delivery affecting pregnancy - Planning VBAC.  TOLAC consent signed 04/05/2020.  She does not have any new questions about this.   Preterm labor symptoms and general obstetric precautions including but not limited to vaginal bleeding, contractions, leaking of fluid and fetal movement were reviewed in detail with the patient. Please refer to After Visit Summary for other counseling recommendations.   Return in about 3 weeks (around 05/17/2020) for any obstetric concerns, Office ob visit (MD or APP).   Jerene Bears, MD

## 2020-05-17 ENCOUNTER — Ambulatory Visit (INDEPENDENT_AMBULATORY_CARE_PROVIDER_SITE_OTHER): Payer: BC Managed Care – PPO | Admitting: Obstetrics and Gynecology

## 2020-05-17 ENCOUNTER — Encounter: Payer: Self-pay | Admitting: Obstetrics and Gynecology

## 2020-05-17 ENCOUNTER — Other Ambulatory Visit: Payer: Self-pay

## 2020-05-17 VITALS — BP 111/68 | HR 74 | Wt 183.0 lb

## 2020-05-17 DIAGNOSIS — Z641 Problems related to multiparity: Secondary | ICD-10-CM

## 2020-05-17 DIAGNOSIS — B182 Chronic viral hepatitis C: Secondary | ICD-10-CM | POA: Diagnosis not present

## 2020-05-17 DIAGNOSIS — Z8632 Personal history of gestational diabetes: Secondary | ICD-10-CM

## 2020-05-17 DIAGNOSIS — O0990 Supervision of high risk pregnancy, unspecified, unspecified trimester: Secondary | ICD-10-CM

## 2020-05-17 DIAGNOSIS — Z3009 Encounter for other general counseling and advice on contraception: Secondary | ICD-10-CM

## 2020-05-17 DIAGNOSIS — O09293 Supervision of pregnancy with other poor reproductive or obstetric history, third trimester: Secondary | ICD-10-CM

## 2020-05-17 DIAGNOSIS — Z3A31 31 weeks gestation of pregnancy: Secondary | ICD-10-CM

## 2020-05-17 DIAGNOSIS — O34219 Maternal care for unspecified type scar from previous cesarean delivery: Secondary | ICD-10-CM

## 2020-05-17 DIAGNOSIS — Z7189 Other specified counseling: Secondary | ICD-10-CM

## 2020-05-17 NOTE — Progress Notes (Signed)
   PRENATAL VISIT NOTE  Subjective:  Beth Cordova is a 35 y.o. [id number] at [redacted]w[redacted]d being seen today for ongoing prenatal care.  She is currently monitored for the following issues for this high-risk pregnancy and has Supervision of high risk pregnancy, antepartum; Previous cesarean delivery affecting pregnancy; History of gestational diabetes mellitus (GDM); Late prenatal care affecting pregnancy, antepartum; Prior pregnancy with fetal demise and current pregnancy; Grand multipara; Hepatitis C antibody positive in blood; Unwanted fertility; and Chronic hepatitis C virus infection (HCC) on their problem list.  Patient reports no complaints.  Contractions: Irritability. Vag. Bleeding: None.  Movement: Present. Denies leaking of fluid.   The following portions of the patient's history were reviewed and updated as appropriate: allergies, current medications, past family history, past medical history, past social history, past surgical history and problem list.   Objective:   Vitals:   05/17/20 0921  BP: 111/68  Pulse: 74  Weight: 183 lb (83 kg)    Fetal Status: Fetal Heart Rate (bpm): 134   Movement: Present     General:  Alert, oriented and cooperative. Patient is in no acute distress.  Skin: Skin is warm and dry. No rash noted.   Cardiovascular: Normal heart rate noted  Respiratory: Normal respiratory effort, no problems with respiration noted  Abdomen: Soft, gravid, appropriate for gestational age.  Pain/Pressure: Absent     Pelvic: Cervical exam deferred        Extremities: Normal range of motion.  Edema: None  Mental Status: Normal mood and affect. Normal behavior. Normal judgment and thought content.   Assessment and Plan:  Pregnancy: G7P5105 at [redacted]w[redacted]d  1. Chronic hepatitis C without hepatic coma (HCC) Neg RNA quant  2. Supervision of high risk pregnancy, antepartum  3. Previous cesarean delivery affecting pregnancy For TOLAC  4. History of gestational diabetes mellitus  (GDM) Neg 2 hr GTT  5. Grand multipara  6. Unwanted fertility For BTL  7. Prior pregnancy with fetal demise and current pregnancy in third trimester  8. [redacted] weeks gestation of pregnancy  9. Counseled about COVID-19 virus infection COVID-19 Vaccine Counseling: The patient was counseled on the potential benefits and lack of known risks of COVID vaccination, during pregnancy and breastfeeding, during today's visit. The patient's questions and concerns were addressed today, including safety of the vaccination and potential side effects as they have been published by ACOG and SM[facility]. The patient has been informed that there have not been any documented vaccine related injuries, deaths or birth defects to infant or mom after receiving the COVID-19 vaccine to date. The patient has been made aware that although she is not at increased risk of contracting COVID-19 during pregnancy, she is at increased risk of developing severe disease and complications if she contracts COVID-19 while pregnant. All patient questions were addressed during our visit today. The patient is still unsure of her decision for vaccination.    Preterm labor symptoms and general obstetric precautions including but not limited to vaginal bleeding, contractions, leaking of fluid and fetal movement were reviewed in detail with the patient. Please refer to After Visit Summary for other counseling recommendations.   Return in about 2 weeks (around 05/31/2020) for high OB, in person.  No future appointments.  Conan Bowens, MD

## 2020-06-04 ENCOUNTER — Ambulatory Visit (INDEPENDENT_AMBULATORY_CARE_PROVIDER_SITE_OTHER): Payer: Medicaid Other | Admitting: Obstetrics and Gynecology

## 2020-06-04 ENCOUNTER — Other Ambulatory Visit: Payer: Self-pay

## 2020-06-04 ENCOUNTER — Encounter: Payer: Self-pay | Admitting: Obstetrics and Gynecology

## 2020-06-04 VITALS — BP 99/57 | HR 78 | Wt 184.0 lb

## 2020-06-04 DIAGNOSIS — O0930 Supervision of pregnancy with insufficient antenatal care, unspecified trimester: Secondary | ICD-10-CM

## 2020-06-04 DIAGNOSIS — O34219 Maternal care for unspecified type scar from previous cesarean delivery: Secondary | ICD-10-CM

## 2020-06-04 DIAGNOSIS — Z3009 Encounter for other general counseling and advice on contraception: Secondary | ICD-10-CM

## 2020-06-04 DIAGNOSIS — B182 Chronic viral hepatitis C: Secondary | ICD-10-CM

## 2020-06-04 DIAGNOSIS — Z3A33 33 weeks gestation of pregnancy: Secondary | ICD-10-CM

## 2020-06-04 DIAGNOSIS — Z641 Problems related to multiparity: Secondary | ICD-10-CM

## 2020-06-04 DIAGNOSIS — O0990 Supervision of high risk pregnancy, unspecified, unspecified trimester: Secondary | ICD-10-CM

## 2020-06-04 DIAGNOSIS — O09293 Supervision of pregnancy with other poor reproductive or obstetric history, third trimester: Secondary | ICD-10-CM

## 2020-06-04 DIAGNOSIS — Z8632 Personal history of gestational diabetes: Secondary | ICD-10-CM

## 2020-06-04 NOTE — Progress Notes (Signed)
   PRENATAL VISIT NOTE  Subjective:  Beth Cordova is a 35 y.o. [id number] at [redacted]w[redacted]d being seen today for ongoing prenatal care.  She is currently monitored for the following issues for this high-risk pregnancy and has Supervision of high risk pregnancy, antepartum; Previous cesarean delivery affecting pregnancy; History of gestational diabetes mellitus (GDM); Late prenatal care affecting pregnancy, antepartum; Prior pregnancy with fetal demise and current pregnancy; Grand multipara; Hepatitis C antibody positive in blood; Unwanted fertility; and Chronic hepatitis C virus infection (HCC) on their problem list.  Patient reports no complaints.  Contractions: Irritability. Vag. Bleeding: None.  Movement: Present. Denies leaking of fluid.   The following portions of the patient's history were reviewed and updated as appropriate: allergies, current medications, past family history, past medical history, past social history, past surgical history and problem list.   Objective:   Vitals:   06/04/20 0921  BP: (!) 99/57  Pulse: 78  Weight: 184 lb (83.5 kg)    Fetal Status: Fetal Heart Rate (bpm): 134 Fundal Height: 33 cm Movement: Present     General:  Alert, oriented and cooperative. Patient is in no acute distress.  Skin: Skin is warm and dry. No rash noted.   Cardiovascular: Normal heart rate noted  Respiratory: Normal respiratory effort, no problems with respiration noted  Abdomen: Soft, gravid, appropriate for gestational age.  Pain/Pressure: Absent     Pelvic: Cervical exam deferred        Extremities: Normal range of motion.  Edema: None  Mental Status: Normal mood and affect. Normal behavior. Normal judgment and thought content.   Assessment and Plan:  Pregnancy: [id number] at [redacted]w[redacted]d  1. Supervision of high risk pregnancy, antepartum  2. Previous cesarean delivery affecting pregnancy Plans TOLAC  3. History of gestational diabetes mellitus (GDM) Normal this pregnancy  4. Late  prenatal care affecting pregnancy, antepartum  5. Prior pregnancy with fetal demise and current pregnancy in third trimester  6. Chronic hepatitis C without hepatic coma (HCC)  7. Unwanted fertility For BTL  8. Grand multipara  9. [redacted] weeks gestation of pregnancy   Preterm labor symptoms and general obstetric precautions including but not limited to vaginal bleeding, contractions, leaking of fluid and fetal movement were reviewed in detail with the patient. Please refer to After Visit Summary for other counseling recommendations.   Return in about 2 weeks (around 06/18/2020) for high OB, in person.  No future appointments.  Conan Bowens, MD

## 2020-06-18 ENCOUNTER — Ambulatory Visit (INDEPENDENT_AMBULATORY_CARE_PROVIDER_SITE_OTHER): Payer: BC Managed Care – PPO | Admitting: Obstetrics and Gynecology

## 2020-06-18 ENCOUNTER — Other Ambulatory Visit: Payer: Self-pay

## 2020-06-18 ENCOUNTER — Other Ambulatory Visit (HOSPITAL_COMMUNITY)
Admission: RE | Admit: 2020-06-18 | Discharge: 2020-06-18 | Disposition: A | Discharge status: Home / Self Care | Payer: BC Managed Care – PPO | Source: Ambulatory Visit | Attending: Obstetrics and Gynecology | Admitting: Obstetrics and Gynecology

## 2020-06-18 ENCOUNTER — Encounter: Payer: Self-pay | Admitting: Obstetrics and Gynecology

## 2020-06-18 VITALS — BP 107/60 | HR 79 | Wt 186.0 lb

## 2020-06-18 DIAGNOSIS — O09293 Supervision of pregnancy with other poor reproductive or obstetric history, third trimester: Secondary | ICD-10-CM

## 2020-06-18 DIAGNOSIS — O0990 Supervision of high risk pregnancy, unspecified, unspecified trimester: Secondary | ICD-10-CM | POA: Insufficient documentation

## 2020-06-18 DIAGNOSIS — Z3009 Encounter for other general counseling and advice on contraception: Secondary | ICD-10-CM

## 2020-06-18 DIAGNOSIS — O34219 Maternal care for unspecified type scar from previous cesarean delivery: Secondary | ICD-10-CM

## 2020-06-18 DIAGNOSIS — B182 Chronic viral hepatitis C: Secondary | ICD-10-CM

## 2020-06-18 DIAGNOSIS — Z3A35 35 weeks gestation of pregnancy: Secondary | ICD-10-CM

## 2020-06-18 DIAGNOSIS — Z8632 Personal history of gestational diabetes: Secondary | ICD-10-CM

## 2020-06-18 DIAGNOSIS — Z641 Problems related to multiparity: Secondary | ICD-10-CM

## 2020-06-18 NOTE — Progress Notes (Signed)
   PRENATAL VISIT NOTE  Subjective:  Beth Cordova is a 35 y.o. [id number] at [redacted]w[redacted]d being seen today for ongoing prenatal care.  She is currently monitored for the following issues for this high-risk pregnancy and has Supervision of high risk pregnancy, antepartum; Previous cesarean delivery affecting pregnancy; History of gestational diabetes mellitus (GDM); Late prenatal care affecting pregnancy, antepartum; Prior pregnancy with fetal demise and current pregnancy; Grand multipara; Hepatitis C antibody positive in blood; Unwanted fertility; and Chronic hepatitis C virus infection (HCC) on their problem list.  Patient reports no complaints.  Contractions: Irritability. Vag. Bleeding: None.  Movement: Present. Denies leaking of fluid.   The following portions of the patient's history were reviewed and updated as appropriate: allergies, current medications, past family history, past medical history, past social history, past surgical history and problem list.   Objective:   Vitals:   06/18/20 0946  BP: 107/60  Pulse: 79  Weight: 186 lb (84.4 kg)    Fetal Status: Fetal Heart Rate (bpm): 136 Fundal Height: 35 cm Movement: Present     General:  Alert, oriented and cooperative. Patient is in no acute distress.  Skin: Skin is warm and dry. No rash noted.   Cardiovascular: Normal heart rate noted  Respiratory: Normal respiratory effort, no problems with respiration noted  Abdomen: Soft, gravid, appropriate for gestational age.  Pain/Pressure: Present     Pelvic: Cervical exam performed in the presence of a chaperone Dilation: Fingertip Effacement (%): 50    Extremities: Normal range of motion.  Edema: None  Mental Status: Normal mood and affect. Normal behavior. Normal judgment and thought content.   Assessment and Plan:  Pregnancy: [id number] at [redacted]w[redacted]d 1. Supervision of high risk pregnancy, antepartum - Culture, beta strep (group b only) - Cervicovaginal ancillary only( [place])  2. [redacted]  weeks gestation of pregnancy  3. Chronic hepatitis C without hepatic coma (HCC)  4. Previous cesarean delivery affecting pregnancy For breech For TOLAC  5. History of gestational diabetes mellitus (GDM)  6. Prior pregnancy with fetal demise and current pregnancy in third trimester  7. Unwanted fertility For BTL  8. Grand multipara   Preterm labor symptoms and general obstetric precautions including but not limited to vaginal bleeding, contractions, leaking of fluid and fetal movement were reviewed in detail with the patient. Please refer to After Visit Summary for other counseling recommendations.   Return in about 1 week (around 06/25/2020) for high OB, in person.  No future appointments.  Conan Bowens, MD

## 2020-06-19 LAB — CERVICOVAGINAL ANCILLARY ONLY
Chlamydia: NEGATIVE
Comment: NEGATIVE
Comment: NORMAL
Neisseria Gonorrhea: NEGATIVE

## 2020-06-21 LAB — CULTURE, BETA STREP (GROUP B ONLY)
MICRO NUMBER:: 11618769
SPECIMEN QUALITY:: ADEQUATE

## 2020-06-25 ENCOUNTER — Ambulatory Visit (INDEPENDENT_AMBULATORY_CARE_PROVIDER_SITE_OTHER): Payer: BC Managed Care – PPO | Admitting: Obstetrics & Gynecology

## 2020-06-25 ENCOUNTER — Other Ambulatory Visit: Payer: Self-pay

## 2020-06-25 VITALS — BP 107/68 | HR 83 | Wt 189.0 lb

## 2020-06-25 DIAGNOSIS — O0990 Supervision of high risk pregnancy, unspecified, unspecified trimester: Secondary | ICD-10-CM

## 2020-06-25 DIAGNOSIS — Z23 Encounter for immunization: Secondary | ICD-10-CM

## 2020-06-25 DIAGNOSIS — Z3A36 36 weeks gestation of pregnancy: Secondary | ICD-10-CM

## 2020-06-25 DIAGNOSIS — O34219 Maternal care for unspecified type scar from previous cesarean delivery: Secondary | ICD-10-CM

## 2020-06-25 DIAGNOSIS — Z641 Problems related to multiparity: Secondary | ICD-10-CM

## 2020-06-25 NOTE — Progress Notes (Signed)
   PRENATAL VISIT NOTE  Subjective:  Beth Cordova is a 35 y.o. [id number] at [redacted]w[redacted]d being seen today for ongoing prenatal care.  She is currently monitored for the following issues for this high-risk pregnancy and has Supervision of high risk pregnancy, antepartum; Previous cesarean delivery affecting pregnancy; History of gestational diabetes mellitus (GDM); Late prenatal care affecting pregnancy, antepartum; Prior pregnancy with fetal demise and current pregnancy; Grand multipara; Hepatitis C antibody positive in blood; Unwanted fertility; and Chronic hepatitis C virus infection (HCC) on their problem list.  Patient reports no complaints.  Contractions: Irritability. Vag. Bleeding: None.  Movement: Present. Denies leaking of fluid.   The following portions of the patient's history were reviewed and updated as appropriate: allergies, current medications, past family history, past medical history, past social history, past surgical history and problem list.   Objective:   Vitals:   06/25/20 1040  BP: 107/68  Pulse: 83  Weight: 189 lb (85.7 kg)    Fetal Status: Fetal Heart Rate (bpm): 153   Movement: Present     General:  Alert, oriented and cooperative. Patient is in no acute distress.  Skin: Skin is warm and dry. No rash noted.   Cardiovascular: Normal heart rate noted  Respiratory: Normal respiratory effort, no problems with respiration noted  Abdomen: Soft, gravid, appropriate for gestational age.  Pain/Pressure: Present     Pelvic: Cervical exam performed in the presence of a chaperone      2/50/bal  Extremities: Normal range of motion.  Edema: None  Mental Status: Normal mood and affect. Normal behavior. Normal judgment and thought content.   Assessment and Plan:  Pregnancy: [id number] at [redacted]w[redacted]d 1. Supervision of high risk pregnancy, antepartum Pt desires Tdap and given today GBS negative  Term labor symptoms and general obstetric precautions including but not limited to vaginal  bleeding, contractions, leaking of fluid and fetal movement were reviewed in detail with the patient. Please refer to After Visit Summary for other counseling recommendations.   RTC 1 week for routine OB appt  Elsie Lincoln, MD

## 2020-06-30 ENCOUNTER — Other Ambulatory Visit: Payer: Self-pay

## 2020-06-30 ENCOUNTER — Inpatient Hospital Stay (HOSPITAL_COMMUNITY)
Admit: 2020-06-30 | Discharge: 2020-06-30 | Disposition: A | Discharge status: Home / Self Care | Payer: BC Managed Care – PPO | Source: Intra-hospital | Attending: Obstetrics & Gynecology | Admitting: Obstetrics & Gynecology

## 2020-06-30 ENCOUNTER — Encounter (HOSPITAL_COMMUNITY): Payer: Self-pay | Admitting: Obstetrics & Gynecology

## 2020-06-30 DIAGNOSIS — O328XX0 Maternal care for other malpresentation of fetus, not applicable or unspecified: Secondary | ICD-10-CM | POA: Diagnosis not present

## 2020-06-30 DIAGNOSIS — O471 False labor at or after 37 completed weeks of gestation: Secondary | ICD-10-CM

## 2020-06-30 DIAGNOSIS — Z87891 Personal history of nicotine dependence: Secondary | ICD-10-CM | POA: Diagnosis not present

## 2020-06-30 DIAGNOSIS — O34219 Maternal care for unspecified type scar from previous cesarean delivery: Secondary | ICD-10-CM | POA: Diagnosis not present

## 2020-06-30 DIAGNOSIS — O9842 Viral hepatitis complicating childbirth: Secondary | ICD-10-CM | POA: Diagnosis not present

## 2020-06-30 DIAGNOSIS — Z3A00 Weeks of gestation of pregnancy not specified: Secondary | ICD-10-CM | POA: Diagnosis not present

## 2020-06-30 DIAGNOSIS — B182 Chronic viral hepatitis C: Secondary | ICD-10-CM | POA: Diagnosis not present

## 2020-06-30 DIAGNOSIS — O0990 Supervision of high risk pregnancy, unspecified, unspecified trimester: Secondary | ICD-10-CM

## 2020-06-30 DIAGNOSIS — Z302 Encounter for sterilization: Secondary | ICD-10-CM | POA: Diagnosis not present

## 2020-06-30 DIAGNOSIS — Z20822 Contact with and (suspected) exposure to covid-19: Secondary | ICD-10-CM | POA: Diagnosis not present

## 2020-06-30 DIAGNOSIS — Z3A37 37 weeks gestation of pregnancy: Secondary | ICD-10-CM

## 2020-06-30 DIAGNOSIS — O321XX0 Maternal care for breech presentation, not applicable or unspecified: Secondary | ICD-10-CM | POA: Diagnosis not present

## 2020-06-30 DIAGNOSIS — O34211 Maternal care for low transverse scar from previous cesarean delivery: Secondary | ICD-10-CM | POA: Diagnosis not present

## 2020-06-30 NOTE — Discharge Instructions (Signed)

## 2020-06-30 NOTE — MAU Note (Signed)
Patient arrived to MAU with complaint of contractions that started since 7pm yesterday. She stated that her contractions are 10-20 mins apart, but she lives 30 minutes away. Patient stated that she lost her mucous plug about 3 days ago. No vaginal bleeding, no leakage of fluid. Positive fetal movement reported. efm initiated.

## 2020-07-01 ENCOUNTER — Inpatient Hospital Stay (HOSPITAL_COMMUNITY)
Admission: AD | Admit: 2020-07-01 | Discharge: 2020-07-04 | DRG: 784 | Disposition: A | Discharge status: Home / Self Care | Payer: BC Managed Care – PPO | Attending: Obstetrics & Gynecology | Admitting: Obstetrics & Gynecology

## 2020-07-01 ENCOUNTER — Other Ambulatory Visit: Payer: Self-pay

## 2020-07-01 ENCOUNTER — Encounter (HOSPITAL_COMMUNITY)
Admission: AD | Disposition: A | Discharge status: Home / Self Care | Payer: Self-pay | Source: Home / Self Care | Attending: Obstetrics & Gynecology

## 2020-07-01 ENCOUNTER — Inpatient Hospital Stay (HOSPITAL_COMMUNITY): Payer: BC Managed Care – PPO | Admitting: Anesthesiology

## 2020-07-01 ENCOUNTER — Encounter (HOSPITAL_COMMUNITY): Payer: Self-pay | Admitting: Obstetrics & Gynecology

## 2020-07-01 DIAGNOSIS — Z3A37 37 weeks gestation of pregnancy: Secondary | ICD-10-CM | POA: Diagnosis not present

## 2020-07-01 DIAGNOSIS — O9842 Viral hepatitis complicating childbirth: Secondary | ICD-10-CM | POA: Diagnosis present

## 2020-07-01 DIAGNOSIS — Z302 Encounter for sterilization: Secondary | ICD-10-CM

## 2020-07-01 DIAGNOSIS — R7689 Other specified abnormal immunological findings in serum: Secondary | ICD-10-CM | POA: Diagnosis present

## 2020-07-01 DIAGNOSIS — O321XX0 Maternal care for breech presentation, not applicable or unspecified: Secondary | ICD-10-CM

## 2020-07-01 DIAGNOSIS — Z8632 Personal history of gestational diabetes: Secondary | ICD-10-CM | POA: Diagnosis present

## 2020-07-01 DIAGNOSIS — O328XX0 Maternal care for other malpresentation of fetus, not applicable or unspecified: Secondary | ICD-10-CM | POA: Diagnosis present

## 2020-07-01 DIAGNOSIS — Z641 Problems related to multiparity: Secondary | ICD-10-CM

## 2020-07-01 DIAGNOSIS — Z20822 Contact with and (suspected) exposure to covid-19: Secondary | ICD-10-CM | POA: Diagnosis present

## 2020-07-01 DIAGNOSIS — O09299 Supervision of pregnancy with other poor reproductive or obstetric history, unspecified trimester: Secondary | ICD-10-CM

## 2020-07-01 DIAGNOSIS — Z87891 Personal history of nicotine dependence: Secondary | ICD-10-CM

## 2020-07-01 DIAGNOSIS — Z3009 Encounter for other general counseling and advice on contraception: Secondary | ICD-10-CM | POA: Diagnosis present

## 2020-07-01 DIAGNOSIS — O0930 Supervision of pregnancy with insufficient antenatal care, unspecified trimester: Secondary | ICD-10-CM

## 2020-07-01 DIAGNOSIS — O34211 Maternal care for low transverse scar from previous cesarean delivery: Secondary | ICD-10-CM | POA: Diagnosis present

## 2020-07-01 DIAGNOSIS — B182 Chronic viral hepatitis C: Secondary | ICD-10-CM | POA: Diagnosis present

## 2020-07-01 DIAGNOSIS — R768 Other specified abnormal immunological findings in serum: Secondary | ICD-10-CM | POA: Diagnosis present

## 2020-07-01 DIAGNOSIS — Z98891 History of uterine scar from previous surgery: Secondary | ICD-10-CM

## 2020-07-01 DIAGNOSIS — O34219 Maternal care for unspecified type scar from previous cesarean delivery: Secondary | ICD-10-CM | POA: Diagnosis not present

## 2020-07-01 DIAGNOSIS — O0990 Supervision of high risk pregnancy, unspecified, unspecified trimester: Secondary | ICD-10-CM

## 2020-07-01 DIAGNOSIS — Z3A00 Weeks of gestation of pregnancy not specified: Secondary | ICD-10-CM | POA: Diagnosis not present

## 2020-07-01 DIAGNOSIS — Z9851 Tubal ligation status: Secondary | ICD-10-CM

## 2020-07-01 HISTORY — DX: Urinary tract infection, site not specified: N39.0

## 2020-07-01 LAB — CBC
HCT: 34.9 % — ABNORMAL LOW (ref 36.0–46.0)
Hemoglobin: 12.5 g/dL (ref 12.0–15.0)
MCH: 33.4 pg (ref 26.0–34.0)
MCHC: 35.8 g/dL (ref 30.0–36.0)
MCV: 93.3 fL (ref 80.0–100.0)
Platelets: 186 10*3/uL (ref 150–400)
RBC: 3.74 MIL/uL — ABNORMAL LOW (ref 3.87–5.11)
RDW: 13 % (ref 11.5–15.5)
WBC: 9.4 10*3/uL (ref 4.0–10.5)
nRBC: 0 % (ref 0.0–0.2)

## 2020-07-01 LAB — TYPE AND SCREEN
ABO/RH(D): O POS
Antibody Screen: NEGATIVE

## 2020-07-01 LAB — RESP PANEL BY RT-PCR (FLU A&B, COVID) ARPGX2
Influenza A by PCR: NEGATIVE
Influenza B by PCR: NEGATIVE
SARS Coronavirus 2 by RT PCR: NEGATIVE

## 2020-07-01 SURGERY — Surgical Case
Anesthesia: Spinal

## 2020-07-01 MED ORDER — FENTANYL CITRATE (PF) 100 MCG/2ML IJ SOLN
INTRAMUSCULAR | Status: AC
  Filled 2020-07-01: qty 2

## 2020-07-01 MED ORDER — TRANEXAMIC ACID-NACL 1000-0.7 MG/100ML-% IV SOLN: 1000.0000 mg | INTRAVENOUS | Status: DC

## 2020-07-01 MED ORDER — TRANEXAMIC ACID-NACL 1000-0.7 MG/100ML-% IV SOLN
INTRAVENOUS | Status: DC | PRN
  Administered 2020-07-01: 1000 mg via INTRAVENOUS

## 2020-07-01 MED ORDER — ONDANSETRON HCL 4 MG/2ML IJ SOLN
INTRAMUSCULAR | Status: DC | PRN
  Administered 2020-07-01: 4 mg via INTRAVENOUS

## 2020-07-01 MED ORDER — ONDANSETRON HCL 4 MG/2ML IJ SOLN
INTRAMUSCULAR | Status: AC
  Filled 2020-07-01: qty 2

## 2020-07-01 MED ORDER — LACTATED RINGERS IV BOLUS
1000.0000 mL | Freq: Once | INTRAVENOUS | Status: AC
  Administered 2020-07-01: 1000 mL via INTRAVENOUS

## 2020-07-01 MED ORDER — SODIUM CHLORIDE 0.9 % IV SOLN
INTRAVENOUS | Status: AC
  Filled 2020-07-01: qty 2

## 2020-07-01 MED ORDER — SOD CITRATE-CITRIC ACID 500-334 MG/5ML PO SOLN
30.0000 mL | Freq: Once | ORAL | Status: AC
  Administered 2020-07-01: 30 mL via ORAL
  Filled 2020-07-01: qty 15

## 2020-07-01 MED ORDER — TERBUTALINE SULFATE 1 MG/ML IJ SOLN
0.2500 mg | Freq: Once | INTRAMUSCULAR | Status: DC | PRN
  Filled 2020-07-01: qty 1

## 2020-07-01 MED ORDER — EPHEDRINE SULFATE 50 MG/ML IJ SOLN
INTRAMUSCULAR | Status: DC | PRN
  Administered 2020-07-01: 5 mg via INTRAVENOUS

## 2020-07-01 MED ORDER — PHENYLEPHRINE HCL-NACL 20-0.9 MG/250ML-% IV SOLN
INTRAVENOUS | Status: DC | PRN
  Administered 2020-07-01: 60 ug/min via INTRAVENOUS

## 2020-07-01 MED ORDER — DEXAMETHASONE SODIUM PHOSPHATE 4 MG/ML IJ SOLN
INTRAMUSCULAR | Status: AC
  Filled 2020-07-01: qty 1

## 2020-07-01 MED ORDER — FAMOTIDINE IN NACL 20-0.9 MG/50ML-% IV SOLN
20.0000 mg | Freq: Once | INTRAVENOUS | Status: AC
  Administered 2020-07-01: 20 mg via INTRAVENOUS
  Filled 2020-07-01: qty 50

## 2020-07-01 MED ORDER — TRANEXAMIC ACID-NACL 1000-0.7 MG/100ML-% IV SOLN
INTRAVENOUS | Status: AC
  Filled 2020-07-01: qty 100

## 2020-07-01 MED ORDER — OXYTOCIN-SODIUM CHLORIDE 30-0.9 UT/500ML-% IV SOLN
INTRAVENOUS | Status: AC
  Filled 2020-07-01: qty 500

## 2020-07-01 MED ORDER — SODIUM CHLORIDE 0.9 % IV SOLN
500.0000 mg | INTRAVENOUS | Status: AC
  Administered 2020-07-01: 500 mg via INTRAVENOUS

## 2020-07-01 MED ORDER — PHENYLEPHRINE HCL-NACL 20-0.9 MG/250ML-% IV SOLN
INTRAVENOUS | Status: AC
  Filled 2020-07-01: qty 250

## 2020-07-01 MED ORDER — SCOPOLAMINE 1 MG/3DAYS TD PT72
MEDICATED_PATCH | TRANSDERMAL | Status: DC | PRN
  Administered 2020-07-01: 1 via TRANSDERMAL

## 2020-07-01 MED ORDER — SODIUM CHLORIDE 0.9 % IV SOLN
2.0000 g | INTRAVENOUS | Status: AC
  Administered 2020-07-01: 2 g via INTRAVENOUS

## 2020-07-01 MED ORDER — MORPHINE SULFATE (PF) 0.5 MG/ML IJ SOLN
INTRAMUSCULAR | Status: AC
  Filled 2020-07-01: qty 10

## 2020-07-01 MED ORDER — BUPIVACAINE IN DEXTROSE 0.75-8.25 % IT SOLN
INTRATHECAL | Status: DC | PRN
  Administered 2020-07-01: 1.8 mL via INTRATHECAL

## 2020-07-01 MED ORDER — SCOPOLAMINE 1 MG/3DAYS TD PT72
MEDICATED_PATCH | TRANSDERMAL | Status: AC
  Filled 2020-07-01: qty 1

## 2020-07-01 MED ORDER — SODIUM CHLORIDE 0.9 % IV SOLN
INTRAVENOUS | Status: AC
  Filled 2020-07-01: qty 500

## 2020-07-01 MED ORDER — LACTATED RINGERS IV SOLN: INTRAVENOUS | Status: DC | PRN

## 2020-07-01 MED ORDER — PHENYLEPHRINE HCL (PRESSORS) 10 MG/ML IV SOLN
INTRAVENOUS | Status: DC | PRN
  Administered 2020-07-01: 80 ug via INTRAVENOUS

## 2020-07-01 MED ORDER — OXYTOCIN-SODIUM CHLORIDE 30-0.9 UT/500ML-% IV SOLN
INTRAVENOUS | Status: DC | PRN
  Administered 2020-07-01: 30 [IU] via INTRAVENOUS

## 2020-07-01 SURGICAL SUPPLY — 31 items
CHLORAPREP W/TINT 26ML (MISCELLANEOUS) ×2 IMPLANT
CLAMP CORD UMBIL (MISCELLANEOUS) IMPLANT
CLIP FILSHIE TUBAL LIGA STRL (Clip) ×4 IMPLANT
CLOTH BEACON ORANGE TIMEOUT ST (SAFETY) ×2 IMPLANT
DRSG OPSITE POSTOP 4X10 (GAUZE/BANDAGES/DRESSINGS) ×2 IMPLANT
ELECT REM PT RETURN 9FT ADLT (ELECTROSURGICAL) ×2
ELECTRODE REM PT RTRN 9FT ADLT (ELECTROSURGICAL) ×1 IMPLANT
EXTRACTOR VACUUM M CUP 4 TUBE (SUCTIONS) IMPLANT
GLOVE BIOGEL PI IND STRL 7.0 (GLOVE) ×3 IMPLANT
GLOVE BIOGEL PI INDICATOR 7.0 (GLOVE) ×3
GLOVE ECLIPSE 7.0 STRL STRAW (GLOVE) ×2 IMPLANT
GOWN STRL REUS W/TWL LRG LVL3 (GOWN DISPOSABLE) ×4 IMPLANT
KIT ABG SYR 3ML LUER SLIP (SYRINGE) ×2 IMPLANT
NEEDLE HYPO 22GX1.5 SAFETY (NEEDLE) ×2 IMPLANT
NEEDLE HYPO 25X5/8 SAFETYGLIDE (NEEDLE) ×4 IMPLANT
NS IRRIG 1000ML POUR BTL (IV SOLUTION) ×2 IMPLANT
PACK C SECTION WH (CUSTOM PROCEDURE TRAY) ×2 IMPLANT
PAD ABD 7.5X8 STRL (GAUZE/BANDAGES/DRESSINGS) ×2 IMPLANT
PAD OB MATERNITY 4.3X12.25 (PERSONAL CARE ITEMS) ×2 IMPLANT
PENCIL SMOKE EVAC W/HOLSTER (ELECTROSURGICAL) ×2 IMPLANT
RTRCTR C-SECT PINK 25CM LRG (MISCELLANEOUS) IMPLANT
SPONGE GAUZE 4X4 12PLY STER LF (GAUZE/BANDAGES/DRESSINGS) ×4 IMPLANT
SUT PDS AB 0 CTX 36 PDP370T (SUTURE) ×2 IMPLANT
SUT PLAIN 2 0 XLH (SUTURE) IMPLANT
SUT VIC AB 0 CTX 36 (SUTURE) ×2
SUT VIC AB 0 CTX36XBRD ANBCTRL (SUTURE) ×2 IMPLANT
SUT VIC AB 4-0 KS 27 (SUTURE) ×2 IMPLANT
SYR CONTROL 10ML LL (SYRINGE) ×2 IMPLANT
TOWEL OR 17X24 6PK STRL BLUE (TOWEL DISPOSABLE) ×2 IMPLANT
TRAY FOLEY W/BAG SLVR 14FR LF (SET/KITS/TRAYS/PACK) ×2 IMPLANT
WATER STERILE IRR 1000ML POUR (IV SOLUTION) ×2 IMPLANT

## 2020-07-01 NOTE — MAU Note (Signed)
Ctxs since yesterday. 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 Some bloody show but denies LOF. 4cm yesterday.

## 2020-07-01 NOTE — MAU Provider Note (Signed)
Pt informed that the ultrasound is considered a limited OB ultrasound and is not intended to be a complete ultrasound exam.  Patient also informed that the ultrasound is not being completed with the intent of assessing for fetal or placental anomalies or any pelvic abnormalities.  Explained that the purpose of today's ultrasound is to assess for  presentation (breech, head in RUQ).  Patient acknowledges the purpose of the exam and the limitations of the study.   Marylen Ponto, NP  9:17 PM 07/01/2020

## 2020-07-01 NOTE — H&P (Signed)
Obstetric Preoperative History and Physical  Beth Cordova is a 35 y.o. [id number] with IUP at [redacted]w[redacted]d presenting in labor.   History of cesarean section in 2020 for breech presentation at [facility].  Had contractions since yesterday, came to MAU yesterday and was found to be 3 cm dilated.  Was sent home. Returned today with stronger and more frequent contractions and blood show,  Cervical exam was 4/90/high.  Bedside ultrasound showed breech presentation, patient aware this is an indication for repeat cesarean section.  She reports good fetal movement, no bleeding, no contractions, no leaking of fluid.  No acute preoperative concerns.    Cesarean Section Indication: malpresentation: breech; previous cesarean section  Prenatal Course Source of Care: [facility] Pregnancy complications or risks: Patient Active Problem List   Diagnosis Date Noted  . Hepatitis C antibody positive in blood 04/05/2020  . Unwanted fertility 04/05/2020  . Grand multipara 02/11/2020  . Supervision of high risk pregnancy, antepartum 01/17/2020  . Previous cesarean delivery affecting pregnancy 01/17/2020  . History of gestational diabetes mellitus (GDM) 01/17/2020  . Late prenatal care affecting pregnancy, antepartum 01/17/2020  . Prior pregnancy with fetal demise and current pregnancy 01/17/2020  . Chronic hepatitis C virus infection (HCC) 10/20/2018    Nursing Staff Provider  Office Location  [place] Dating  By 23 week [place]  Language  English Anatomy US  Normal  Flu Vaccine  Declined Genetic Screen  NIPS:Low-Risk Female   AFP: Negative     TDaP Vaccine  06/25/20 Hgb A1C or  GTT Third trimester:  neg  COVID Vaccine Declined   LAB RESULTS   Rhogam  n/a Blood Type O/Positive/-- (10/05 1121)   Feeding Plan Breast Antibody Negative (10/05 1121)  Contraception BTL Rubella 3.02 (10/05 1121) IMMUNE  Circumcision Yes-inpatient RPR Non Reactive (10/05 1121)   Pediatrician  [facility] HBsAg Negative (10/05 1121)    Support Person FOB HCVAb Chronic Hep C with NEGATIVE RNA  Prenatal Classes N/A HIV Non Reactive (10/05 1121)     BTL Consent 04/05/20 GBS  Negative  VBAC Consent 04/05/20 Pap 2019-Normal    Hgb Electro  Negative  BP Cuff Yes, takes occasionally CF Negative    SMA Negative    Waterbirth n/a    Past Medical History:  Diagnosis Date  . Bowel dysfunction    States she has to eat 5 small meals a day  . Bowel obstruction (HCC)   . Hep C w/o coma, chronic (HCC)   . History of preterm delivery 12/23/2016   20 week delivery at home PPROM then delivery.   . Ovarian cyst   . Trichomonas infection   . UTI (urinary tract infection)     Past Surgical History:  Procedure Laterality Date  . CESAREAN SECTION    . NO PAST SURGERIES    . WISDOM TOOTH EXTRACTION      OB History  Gravida Para Term Preterm AB Living  7 6 5 1  0 5  SAB IAB Ectopic Multiple Live Births  0     0 6    # Outcome Date GA Lbr Len/2nd Weight Sex Delivery Anes PTL Lv  7 Current           6 Term 12/07/18 [redacted]w[redacted]d  3430 g M CS-LTranv  N LIV  5 Term 04/09/17 [redacted]w[redacted]d 05:38 / 00:11 3354 g F Vag-Spont EPI  LIV  4 Preterm 04/09/15 [redacted]w[redacted]d  227 g F Vag-Spont  Y DEC  3 Term 04/24/12 [redacted]w[redacted]d  2637  g F Vag-Spont EPI  LIV  2 Term 04/01/05 [redacted]w[redacted]d  3062 g F Vag-Spont EPI N LIV  1 Term 04/08/04 [redacted]w[redacted]d  2863 g F Vag-Spont EPI N LIV    Social History   Socioeconomic History  . Marital status: Single    Spouse name: Not on file  . Number of children: 6  . Years of education: Not on file  . Highest education level: Some college, no degree  Occupational History  . Occupation: Psychologist, prison and probation services    Comment: [employer]  Tobacco Use  . Smoking status: Former Games developer  . Smokeless tobacco: Never Used  . Tobacco comment: quit Aug 2018  Vaping Use  . Vaping Use: Never used  Substance and Sexual Activity  . Alcohol use: No  . Drug use: No  . Sexual activity: Yes    Birth control/protection: None  Other Topics Concern  .  Not on file  Social History Narrative  . Not on file   Social Determinants of Health   Financial Resource Strain: Low Risk   . Difficulty of Paying Living Expenses: Not hard at all  Food Insecurity: No Food Insecurity  . Worried About Programme researcher, broadcasting/film/video in the Last Year: Never true  . Ran Out of Food in the Last Year: Never true  Transportation Needs: No Transportation Needs  . Lack of Transportation (Medical): No  . Lack of Transportation (Non-Medical): No  Physical Activity: Inactive  . Days of Exercise per Week: 0 days  . Minutes of Exercise per Session: 0 min  Stress: No Stress Concern Present  . Feeling of Stress : Not at all  Social Connections: Not on file    Family History  Problem Relation Age of Onset  . Hypertension Father   . Asthma Brother   . Cancer Maternal Grandmother        bone  . Hypertension Maternal Grandmother   . Stroke Maternal Grandmother   . Diabetes Paternal Grandmother   . Diabetes Paternal Grandfather     Medications Prior to Admission  Medication Sig Dispense Refill Last Dose  . prenatal vitamin w/FE, FA (PRENATAL 1 + 1) 27-1 MG TABS tablet Take 1 tablet by mouth daily at 12 noon. 30 tablet 0 07/01/2020 at Unknown time  . Blood Pressure Monitoring (BLOOD PRESSURE MONITOR AUTOMAT) DEVI 1 Device by Does not apply route daily. Automatic blood pressure cuff regular size. To monitor blood pressure regularly at home. ICD-10 code: O09.90 1 each 0   . Misc. Devices (GOJJI WEIGHT SCALE) MISC 1 Device by Does not apply route daily as needed. To weight self daily as needed at home. ICD-10 code: O09.90 1 each 0     No Known Allergies  Review of Systems: Pertinent items noted in HPI and remainder of comprehensive ROS otherwise negative.  Physical Exam: BP 118/75 (BP Location: Right Arm)   Pulse 76   Temp 98 F (36.7 C)   Resp 18   Ht 5\' 3"  (1.6 m)   Wt 86.2 kg   LMP 09/28/2019 (Exact Date) Comment: IUD removed 2 months after delivery of last child   BMI 33.66 kg/m  FHR tracing: 135 bpm, moderate variability, +accels, no decels >> Category 1 FHR CONSTITUTIONAL: Well-developed, well-nourished female in no acute distress.  HENT:  Normocephalic, atraumatic, External right and left ear normal. Oropharynx is clear and moist EYES: Conjunctivae and EOM are normal. Pupils are equal, round, and reactive to light. No scleral icterus.  NECK: Normal  range of motion, supple, no masses SKIN: Skin is warm and dry. No rash noted. Not diaphoretic. No erythema. No pallor. NEUROLGIC: Alert and oriented to person, place, and time. Normal reflexes, muscle tone coordination. No cranial nerve deficit noted. PSYCHIATRIC: Normal mood and affect. Normal behavior. Normal judgment and thought content. CARDIOVASCULAR: Normal heart rate noted, regular rhythm RESPIRATORY: Effort and breath sounds normal, no problems with respiration noted ABDOMEN: Soft, nontender, nondistended, gravid. Well-healed Pfannenstiel incision. PELVIC: Dilation: 4 Effacement (%): 90 Cervical Position: Anterior Presentation:  (breech by bedside u/s by Donia Ast NP.NP discussed with Dr Macon Large) Exam by:: Quintella Baton RNC MUSCULOSKELETAL: Normal range of motion. No edema and no tenderness. 2+ distal pulses.  Pertinent Labs/Studies:   Results for orders placed or performed during the hospital encounter of 07/01/20 (from the past 72 hour(s))  Type and screen     Status: None (Preliminary result)   Collection Time: 07/01/20  9:45 PM  Result Value Ref Range   ABO/RH(D) PENDING    Antibody Screen PENDING    Sample Expiration      07/04/2020,2359 Performed at [facility], [address], [place], [place] [address]     Assessment and Plan: Beth Cordova is a 35 y.o. [id number] at [redacted]w[redacted]d being admitted for repeat cesarean section given labor, breech fetal presentation.  Also desires bilateral tubal sterilization.  The risks of cesarean section were discussed with the patient including  but were not limited to: bleeding which may require transfusion or reoperation; infection which may require antibiotics; injury to bowel, bladder, ureters or other surrounding organs; injury to the fetus; need for additional procedures including hysterectomy in the event of a life-threatening hemorrhage; formation of adhesions; placental abnormalities wth subsequent pregnancies; incisional problems; thromboembolic phenomenon and other postoperative/anesthesia complications.  Patient also desires permanent sterilization.  Other reversible forms of contraception were discussed with patient; she declines all other modalities. This will be done either via bilateral salpingectomy or bilateral application of Filshie clips.  Risks of procedure discussed with patient including but not limited to: risk of regret, permanence of method, bleeding, infection, injury to surrounding organs and need for additional procedures.  Failure risk of about 1% with increased risk of ectopic gestation if pregnancy occurs was also discussed with patient.  Also discussed possibility of post-tubal pain syndrome. The patient concurred with the proposed plan, giving informed written consent for the procedures.  Patient has been NPO since 1830 she will remain NPO for procedure. Anesthesia and OR aware.  Preoperative prophylactic antibiotics and SCDs ordered on call to the OR.  To OR when ready.    Jaynie Collins, MD, FACOG Obstetrician & Gynecologist, [facility] [employer], [facility]

## 2020-07-01 NOTE — Progress Notes (Signed)
To OR via stretcher

## 2020-07-01 NOTE — Anesthesia Procedure Notes (Signed)
Spinal  Patient location during procedure: OR Start time: 07/01/2020 11:15 PM End time: 07/01/2020 11:20 PM Reason for block: surgical anesthesia Staffing Performed: anesthesiologist  Anesthesiologist: Elmer Picker, MD Preanesthetic Checklist Completed: patient identified, IV checked, risks and benefits discussed, surgical consent, monitors and equipment checked, pre-op evaluation and timeout performed Spinal Block Patient position: sitting Prep: DuraPrep and site prepped and draped Patient monitoring: cardiac monitor, continuous pulse ox and blood pressure Approach: midline Location: L3-4 Injection technique: single-shot Needle Needle type: Pencan  Needle gauge: 24 G Needle length: 9 cm Assessment Sensory level: T6 Events: CSF return Additional Notes Functioning IV was confirmed and monitors were applied. Sterile prep and drape, including hand hygiene and sterile gloves were used. The patient was positioned and the spine was prepped. The skin was anesthetized with lidocaine.  Free flow of clear CSF was obtained prior to injecting local anesthetic into the CSF.  The spinal needle aspirated freely following injection.  The needle was carefully withdrawn.  The patient tolerated the procedure well.

## 2020-07-01 NOTE — Progress Notes (Signed)
N Nugent NP notified of pt's admission to [facility] and status. Aware of sve and no presenting part felt. Hx c/s last delivery for breech. NP in to see pt with bedside u/s for presentation

## 2020-07-01 NOTE — MAU Note (Signed)
Dr Macon Large in to see pt and consent for repeat c/s. Consent signed and questions answered

## 2020-07-01 NOTE — Anesthesia Preprocedure Evaluation (Addendum)
Anesthesia Evaluation  Patient identified by MRN, date of birth, ID band Patient awake    Reviewed: Allergy & Precautions, NPO status , Patient's Chart, lab work & pertinent test results  Airway Mallampati: II  TM Distance: >3 FB Neck ROM: Full    Dental no notable dental hx.    Pulmonary neg pulmonary ROS, former smoker,    Pulmonary exam normal breath sounds clear to auscultation       Cardiovascular negative cardio ROS Normal cardiovascular exam Rhythm:Regular Rate:Normal     Neuro/Psych negative neurological ROS  negative psych ROS   GI/Hepatic negative GI ROS, (+) Hepatitis -, C  Endo/Other  negative endocrine ROS  Renal/GU negative Renal ROS  negative genitourinary   Musculoskeletal negative musculoskeletal ROS (+)   Abdominal   Peds  Hematology negative hematology ROS (+)   Anesthesia Other Findings Repeat C/S for labor and breech presentation  Reproductive/Obstetrics (+) Pregnancy                            Anesthesia Physical Anesthesia Plan  ASA: II  Anesthesia Plan: Spinal   Post-op Pain Management:    Induction:   PONV Risk Score and Plan: Treatment may vary due to age or medical condition  Airway Management Planned: Natural Airway  Additional Equipment:   Intra-op Plan:   Post-operative Plan:   Informed Consent: I have reviewed the patients History and Physical, chart, labs and discussed the procedure including the risks, benefits and alternatives for the proposed anesthesia with the patient or authorized representative who has indicated his/her understanding and acceptance.     Dental advisory given  Plan Discussed with: CRNA  Anesthesia Plan Comments:         Anesthesia Quick Evaluation

## 2020-07-01 NOTE — Progress Notes (Signed)
Lanice Shirts CNM called in BS. Placing foley bulb and will call back when finished.

## 2020-07-02 ENCOUNTER — Encounter (HOSPITAL_COMMUNITY): Payer: Self-pay | Admitting: Obstetrics & Gynecology

## 2020-07-02 DIAGNOSIS — O34211 Maternal care for low transverse scar from previous cesarean delivery: Secondary | ICD-10-CM

## 2020-07-02 DIAGNOSIS — Z302 Encounter for sterilization: Secondary | ICD-10-CM

## 2020-07-02 DIAGNOSIS — Z3A37 37 weeks gestation of pregnancy: Secondary | ICD-10-CM

## 2020-07-02 DIAGNOSIS — O321XX0 Maternal care for breech presentation, not applicable or unspecified: Secondary | ICD-10-CM

## 2020-07-02 LAB — SYPHILIS: RPR W/REFLEX TO RPR TITER AND TREPONEMAL ANTIBODIES, TRADITIONAL SCREENING AND DIAGNOSIS ALGORITHM: RPR Ser Ql: NONREACTIVE

## 2020-07-02 LAB — CBC
HCT: 31.7 % — ABNORMAL LOW (ref 36.0–46.0)
Hemoglobin: 10.9 g/dL — ABNORMAL LOW (ref 12.0–15.0)
MCH: 32.7 pg (ref 26.0–34.0)
MCHC: 34.4 g/dL (ref 30.0–36.0)
MCV: 95.2 fL (ref 80.0–100.0)
Platelets: 147 10*3/uL — ABNORMAL LOW (ref 150–400)
RBC: 3.33 MIL/uL — ABNORMAL LOW (ref 3.87–5.11)
RDW: 12.9 % (ref 11.5–15.5)
WBC: 13.1 10*3/uL — ABNORMAL HIGH (ref 4.0–10.5)
nRBC: 0 % (ref 0.0–0.2)

## 2020-07-02 LAB — CREATININE, SERUM
Creatinine, Ser: 0.72 mg/dL (ref 0.44–1.00)
GFR, Estimated: >60 mL/min (ref >60)

## 2020-07-02 MED ORDER — STERILE WATER FOR IRRIGATION IR SOLN
Status: DC | PRN
  Administered 2020-07-02: 1

## 2020-07-02 MED ORDER — EPHEDRINE 5 MG/ML INJ
INTRAVENOUS | Status: AC
  Filled 2020-07-02: qty 10

## 2020-07-02 MED ORDER — PRENATAL MULTIVITAMIN CH
1.0000 | ORAL_TABLET | Freq: Every day | ORAL | Status: DC
  Administered 2020-07-02 – 2020-07-04 (×3): 1 via ORAL
  Filled 2020-07-02 (×3): qty 1

## 2020-07-02 MED ORDER — KETOROLAC TROMETHAMINE 30 MG/ML IJ SOLN
30.0000 mg | Freq: Once | INTRAMUSCULAR | Status: AC | PRN
  Administered 2020-07-02: 30 mg via INTRAVENOUS

## 2020-07-02 MED ORDER — NALOXONE HCL 0.4 MG/ML IJ SOLN: 0.4000 mg | INTRAMUSCULAR | Status: DC | PRN

## 2020-07-02 MED ORDER — SENNOSIDES-DOCUSATE SODIUM 8.6-50 MG PO TABS
2.0000 | ORAL_TABLET | Freq: Every day | ORAL | Status: DC
  Administered 2020-07-03 – 2020-07-04 (×2): 2 via ORAL
  Filled 2020-07-02 (×2): qty 2

## 2020-07-02 MED ORDER — WITCH HAZEL-GLYCERIN EX PADS: 1.0000 "application " | MEDICATED_PAD | CUTANEOUS | Status: DC | PRN

## 2020-07-02 MED ORDER — ACETAMINOPHEN 10 MG/ML IV SOLN
1000.0000 mg | Freq: Once | INTRAVENOUS | Status: DC | PRN
  Administered 2020-07-02: 1000 mg via INTRAVENOUS

## 2020-07-02 MED ORDER — TETANUS-DIPHTH-ACELL PERTUSSIS 5-2.5-18.5 LF-MCG/0.5 IM SUSY: 0.5000 mL | PREFILLED_SYRINGE | Freq: Once | INTRAMUSCULAR | Status: DC

## 2020-07-02 MED ORDER — DIPHENHYDRAMINE HCL 25 MG PO CAPS: 25.0000 mg | ORAL_CAPSULE | ORAL | Status: DC | PRN

## 2020-07-02 MED ORDER — OXYTOCIN-SODIUM CHLORIDE 30-0.9 UT/500ML-% IV SOLN
INTRAVENOUS | Status: AC
  Filled 2020-07-02: qty 500

## 2020-07-02 MED ORDER — MEASLES, MUMPS & RUBELLA VAC IJ SOLR: 0.5000 mL | Freq: Once | INTRAMUSCULAR | Status: DC

## 2020-07-02 MED ORDER — DIPHENHYDRAMINE HCL 50 MG/ML IJ SOLN: 12.5000 mg | INTRAMUSCULAR | Status: DC | PRN

## 2020-07-02 MED ORDER — IBUPROFEN 800 MG PO TABS
800.0000 mg | ORAL_TABLET | Freq: Four times a day (QID) | ORAL | Status: DC
  Administered 2020-07-03 – 2020-07-04 (×5): 800 mg via ORAL
  Filled 2020-07-02 (×5): qty 1

## 2020-07-02 MED ORDER — TRAMADOL HCL 50 MG PO TABS: 50.0000 mg | ORAL_TABLET | Freq: Four times a day (QID) | ORAL | Status: DC | PRN

## 2020-07-02 MED ORDER — SODIUM CHLORIDE 0.9% FLUSH: 3.0000 mL | INTRAVENOUS | Status: DC | PRN

## 2020-07-02 MED ORDER — HYDROMORPHONE HCL 1 MG/ML IJ SOLN: 1.0000 mg | INTRAMUSCULAR | Status: DC | PRN

## 2020-07-02 MED ORDER — DIPHENHYDRAMINE HCL 25 MG PO CAPS: 25.0000 mg | ORAL_CAPSULE | Freq: Four times a day (QID) | ORAL | Status: DC | PRN

## 2020-07-02 MED ORDER — NALOXONE HCL 4 MG/10ML IJ SOLN
1.0000 ug/kg/h | INTRAMUSCULAR | Status: DC | PRN
  Filled 2020-07-02: qty 5

## 2020-07-02 MED ORDER — NALBUPHINE HCL 10 MG/ML IJ SOLN
5.0000 mg | INTRAMUSCULAR | Status: DC | PRN
Start: 2020-07-02 — End: 2020-07-04

## 2020-07-02 MED ORDER — DIBUCAINE (PERIANAL) 1 % EX OINT: 1.0000 "application " | TOPICAL_OINTMENT | CUTANEOUS | Status: DC | PRN

## 2020-07-02 MED ORDER — SCOPOLAMINE 1 MG/3DAYS TD PT72: 1.0000 | MEDICATED_PATCH | Freq: Once | TRANSDERMAL | Status: DC

## 2020-07-02 MED ORDER — KETOROLAC TROMETHAMINE 30 MG/ML IJ SOLN: 30.0000 mg | Freq: Four times a day (QID) | INTRAMUSCULAR | Status: AC | PRN

## 2020-07-02 MED ORDER — COCONUT OIL OIL: 1.0000 "application " | TOPICAL_OIL | Status: DC | PRN

## 2020-07-02 MED ORDER — ACETAMINOPHEN 10 MG/ML IV SOLN
INTRAVENOUS | Status: AC
  Filled 2020-07-02: qty 100

## 2020-07-02 MED ORDER — PHENYLEPHRINE 40 MCG/ML (10ML) SYRINGE FOR IV PUSH (FOR BLOOD PRESSURE SUPPORT)
PREFILLED_SYRINGE | INTRAVENOUS | Status: AC
  Filled 2020-07-02: qty 20

## 2020-07-02 MED ORDER — FENTANYL CITRATE (PF) 100 MCG/2ML IJ SOLN
INTRAMUSCULAR | Status: DC | PRN
  Administered 2020-07-01: 15 ug via INTRATHECAL

## 2020-07-02 MED ORDER — NALBUPHINE HCL 10 MG/ML IJ SOLN: 5.0000 mg | INTRAMUSCULAR | Status: DC | PRN

## 2020-07-02 MED ORDER — MAGNESIUM HYDROXIDE 400 MG/5ML PO SUSP: 30.0000 mL | ORAL | Status: DC | PRN

## 2020-07-02 MED ORDER — MORPHINE SULFATE (PF) 0.5 MG/ML IJ SOLN
INTRAMUSCULAR | Status: DC | PRN
  Administered 2020-07-01: .15 mg via INTRATHECAL

## 2020-07-02 MED ORDER — SIMETHICONE 80 MG PO CHEW
80.0000 mg | CHEWABLE_TABLET | ORAL | Status: DC | PRN
  Administered 2020-07-03 (×2): 80 mg via ORAL
  Filled 2020-07-02 (×2): qty 1

## 2020-07-02 MED ORDER — OXYCODONE HCL 5 MG PO TABS
5.0000 mg | ORAL_TABLET | ORAL | Status: DC | PRN
  Administered 2020-07-02 (×2): 10 mg via ORAL
  Administered 2020-07-02: 5 mg via ORAL
  Administered 2020-07-03 – 2020-07-04 (×6): 10 mg via ORAL
  Filled 2020-07-02 (×7): qty 2
  Filled 2020-07-02: qty 1

## 2020-07-02 MED ORDER — FENTANYL CITRATE (PF) 100 MCG/2ML IJ SOLN: 25.0000 ug | INTRAMUSCULAR | Status: DC | PRN

## 2020-07-02 MED ORDER — LACTATED RINGERS IV SOLN: INTRAVENOUS | Status: DC

## 2020-07-02 MED ORDER — OXYCODONE-ACETAMINOPHEN 5-325 MG PO TABS
2.0000 | ORAL_TABLET | ORAL | Status: DC | PRN
  Administered 2020-07-04: 2 via ORAL
  Filled 2020-07-02: qty 2

## 2020-07-02 MED ORDER — NALBUPHINE HCL 10 MG/ML IJ SOLN: 5.0000 mg | Freq: Once | INTRAMUSCULAR | Status: DC | PRN

## 2020-07-02 MED ORDER — GABAPENTIN 300 MG PO CAPS
300.0000 mg | ORAL_CAPSULE | Freq: Two times a day (BID) | ORAL | Status: DC
  Administered 2020-07-02 – 2020-07-04 (×5): 300 mg via ORAL
  Filled 2020-07-02 (×5): qty 1

## 2020-07-02 MED ORDER — OXYTOCIN-SODIUM CHLORIDE 30-0.9 UT/500ML-% IV SOLN: 2.5000 [IU]/h | INTRAVENOUS | Status: AC

## 2020-07-02 MED ORDER — ACETAMINOPHEN 500 MG PO TABS
1000.0000 mg | ORAL_TABLET | Freq: Four times a day (QID) | ORAL | Status: AC
  Administered 2020-07-02 – 2020-07-03 (×4): 1000 mg via ORAL
  Filled 2020-07-02 (×4): qty 2

## 2020-07-02 MED ORDER — KETOROLAC TROMETHAMINE 30 MG/ML IJ SOLN
INTRAMUSCULAR | Status: AC
  Filled 2020-07-02: qty 1

## 2020-07-02 MED ORDER — ENOXAPARIN SODIUM 40 MG/0.4ML ~~LOC~~ SOLN
40.0000 mg | SUBCUTANEOUS | Status: DC
  Administered 2020-07-02 – 2020-07-03 (×2): 40 mg via SUBCUTANEOUS
  Filled 2020-07-02 (×2): qty 0.4

## 2020-07-02 MED ORDER — FERROUS SULFATE 325 (65 FE) MG PO TABS
325.0000 mg | ORAL_TABLET | ORAL | Status: DC
  Administered 2020-07-02 – 2020-07-04 (×2): 325 mg via ORAL
  Filled 2020-07-02 (×2): qty 1

## 2020-07-02 MED ORDER — ONDANSETRON HCL 4 MG/2ML IJ SOLN: 4.0000 mg | Freq: Three times a day (TID) | INTRAMUSCULAR | Status: DC | PRN

## 2020-07-02 MED ORDER — KETOROLAC TROMETHAMINE 30 MG/ML IJ SOLN
30.0000 mg | Freq: Four times a day (QID) | INTRAMUSCULAR | Status: AC
  Administered 2020-07-02 – 2020-07-03 (×4): 30 mg via INTRAVENOUS
  Filled 2020-07-02 (×4): qty 1

## 2020-07-02 MED ORDER — MENTHOL 3 MG MT LOZG: 1.0000 | LOZENGE | OROMUCOSAL | Status: DC | PRN

## 2020-07-02 MED ORDER — ZOLPIDEM TARTRATE 5 MG PO TABS: 5.0000 mg | ORAL_TABLET | Freq: Every evening | ORAL | Status: DC | PRN

## 2020-07-02 NOTE — Transfer of Care (Signed)
Immediate Anesthesia Transfer of Care Note  Patient: Beth Cordova  Procedure(s) Performed: CESAREAN SECTION (N/A )  Patient Location: PACU  Anesthesia Type:Spinal  Level of Consciousness: awake, alert  and oriented  Airway & Oxygen Therapy: Patient Spontanous Breathing  Post-op Assessment: Report given to RN and Post -op Vital signs reviewed and stable  Post vital signs: Reviewed and stable  Last Vitals:  Vitals Value Taken Time  BP    Temp    Pulse    Resp    SpO2      Last Pain:  Vitals:   07/01/20 2033  PainSc: 10-Worst pain ever      Patients Stated Pain Goal: 0 (07/01/20 2033)  Complications: No complications documented.

## 2020-07-02 NOTE — Anesthesia Postprocedure Evaluation (Signed)
Anesthesia Post Note  Patient: Beth Cordova  Procedure(s) Performed: CESAREAN SECTION (N/A )     Patient location during evaluation: PACU Anesthesia Type: Spinal Level of consciousness: oriented and awake and alert Pain management: pain level controlled Vital Signs Assessment: post-procedure vital signs reviewed and stable Respiratory status: spontaneous breathing, respiratory function stable and patient connected to nasal cannula oxygen Cardiovascular status: blood pressure returned to baseline and stable Postop Assessment: no headache, no backache and no apparent nausea or vomiting Anesthetic complications: no   No complications documented.  Last Vitals:  Vitals:   07/02/20 0145 07/02/20 0200  BP: (!) 92/59 90/60  Pulse: 65 65  Resp: 20 15  Temp: 37.1 C   SpO2: 96% 96%    Last Pain:  Vitals:   07/02/20 0200  TempSrc:   PainSc: 6    Pain Goal: Patients Stated Pain Goal: 0 (07/01/20 2033)  LLE Motor Response: No movement due to regional block (07/02/20 0200) LLE Sensation: Numbness (07/02/20 0200) RLE Motor Response: No movement due to regional block (07/02/20 0200) RLE Sensation: Numbness,Tingling (07/02/20 0200) L Sensory Level: L2-Upper inner thigh, upper buttock (07/02/20 0200) R Sensory Level: L2-Upper inner thigh, upper buttock (07/02/20 0200) Epidural/Spinal Function Cutaneous sensation: Able to Discern Pressure (07/02/20 0200), Patient able to flex knees: No (07/02/20 0200), Patient able to lift hips off bed: No (07/02/20 0200), Back pain beyond tenderness at insertion site: No (07/02/20 0200), Progressively worsening motor and/or sensory loss: No (07/02/20 0200), Bowel and/or bladder incontinence post epidural: No (07/02/20 0145)  [name]

## 2020-07-02 NOTE — Plan of Care (Signed)
  Problem: Life Cycle: Goal: Chance of risk for complications during the postpartum period will decrease Note: Discussed with patient the importance of increasing activity throughout the day and ambulating in hallway. Patient is moving well with pain controlled using scheduled and prn meds. Earl Gala, Linda Hedges [place]

## 2020-07-02 NOTE — Discharge Summary (Signed)
Postpartum Discharge Summary  Date of Service updated 07/03/20     Patient Name: Beth Cordova DOB: June 17, 1985 MRN: [id number]  Date of admission: 07/01/2020 Delivery date:07/01/2020  Delivering provider: Verita Schneiders A  Date of discharge: 07/03/2020  Admitting diagnosis: S/P cesarean section [Z98.891] Intrauterine pregnancy: [redacted]w[redacted]d    Secondary diagnosis:  Principal Problem:   S/P cesarean section Active Problems:   Supervision of high risk pregnancy, antepartum   History of gestational diabetes mellitus (GDM)   Late prenatal care affecting pregnancy, antepartum   Prior pregnancy with fetal demise and current pregnancy   Grand multipara   Hepatitis C antibody positive in blood   Unwanted fertility   Chronic hepatitis C virus infection ([place]   History of bilateral tubal ligation  Additional problems: none     Discharge diagnosis: Term Pregnancy Delivered and Chronic Hep C                                              Post partum procedures:postpartum tubal ligation Augmentation: N/A Complications: None  Hospital course: Sceduled C/S   35y.o. yo [id number] admitted to the hospital 07/01/2020 for scheduled cesarean section with the following indication:Malpresentation and laboring.Delivery details are as follows:  Membrane Rupture Time/Date:  ,   Delivery Method:C-Section, Low Transverse  Details of operation can be found in separate operative note.  Patient had an uncomplicated postpartum course.  She is ambulating, tolerating a regular diet, passing flatus, and urinating well. Patient is discharged home in stable condition on  07/03/20        Newborn Data: Birth date:07/01/2020  Birth time:11:51 PM  Gender:Female  Living status:Living  Apgars:3 ,9  Weight:3095 g     Magnesium Sulfate received: No BMZ received: No Rhophylac:N/A MMR:N/A T-DaP:Given prenatally Flu: No Transfusion:No  Physical exam  Vitals:   07/02/20 1220 07/02/20 1610 07/02/20 2103  07/03/20 0616  BP: (!) 92/59 (!) 96/54 (!) 100/55 107/63  Pulse:   (!) 57 71  Resp: '16 18 18 18  ' Temp: 98.5 F (36.9 C) 97.8 F (36.6 C) 97.9 F (36.6 C) 97.7 F (36.5 C)  TempSrc: Oral Oral Oral Oral  SpO2: 100% 97% 98% 97%  Weight:      Height:       General: alert, cooperative and no distress Lochia: appropriate Uterine Fundus: firm Incision: Dressing is clean, dry, and intact DVT Evaluation: No evidence of DVT seen on physical exam. Labs: Lab Results  Component Value Date   WBC 13.1 (H) 07/02/2020   HGB 10.9 (L) 07/02/2020   HCT 31.7 (L) 07/02/2020   MCV 95.2 07/02/2020   PLT 147 (L) 07/02/2020   CMP Latest Ref Rng & Units 07/02/2020  Creatinine 0.44 - 1.00 mg/dL 0.72   Edinburgh Score: Edinburgh Postnatal Depression Scale Screening Tool 07/02/2020  I have been able to laugh and see the funny side of things. 0  I have looked forward with enjoyment to things. 0  I have blamed myself unnecessarily when things went wrong. 2  I have been anxious or worried for no good reason. 1  I have felt scared or panicky for no good reason. 1  Things have been getting on top of me. 1  I have been so unhappy that I have had difficulty sleeping. 0  I have felt sad or miserable. 1  I  have been so unhappy that I have been crying. 1  The thought of harming myself has occurred to me. 0  Edinburgh Postnatal Depression Scale Total 7     After visit meds:  Allergies as of 07/03/2020   No Known Allergies     Medication List    TAKE these medications   acetaminophen 500 MG tablet Commonly known as: TYLENOL Take 2 tablets (1,000 mg total) by mouth every 6 (six) hours as needed.   Blood Pressure Monitor Automat Devi 1 Device by Does not apply route daily. Automatic blood pressure cuff regular size. To monitor blood pressure regularly at home. ICD-10 code: O09.90   calcium carbonate 500 MG chewable tablet Commonly known as: TUMS - dosed in mg elemental calcium Chew 2 tablets by  mouth 2 (two) times daily as needed for indigestion or heartburn.   coconut oil Oil Apply 1 application topically as needed.   Gojji Weight Scale Misc 1 Device by Does not apply route daily as needed. To weight self daily as needed at home. ICD-10 code: O09.90   ibuprofen 600 MG tablet Commonly known as: ADVIL Take 1 tablet (600 mg total) by mouth every 6 (six) hours as needed.   oxyCODONE 5 MG immediate release tablet Commonly known as: Oxy IR/ROXICODONE Take 1-2 tablets (5-10 mg total) by mouth every 4 (four) hours as needed for moderate pain.   prenatal vitamin w/FE, FA 27-1 MG Tabs tablet Take 1 tablet by mouth daily at 12 noon.        Discharge home in stable condition Infant Feeding: Breast Infant Disposition:home with mother Discharge instruction: per After Visit Summary and Postpartum booklet. Activity: Advance as tolerated. Pelvic rest for 6 weeks.  Diet: routine diet Future Appointments: No future appointments.  Message sent to [facility] 07/03/20 by Sylvester Harder. Patient needs postpartum appt in 4-6 weeks. Please also schedule 1 week incision check. BTL completed Uncomplicated pregnancy, cesarean section.   05/08/5807 Arrie Senate, MD

## 2020-07-02 NOTE — Progress Notes (Signed)
Patient ID: Beth Cordova, female   DOB: [birth year], 35 y.o.   MRN: [id number]   POSTPARTUM PROGRESS NOTE  Post Partum Day 1  Subjective:  Beth Cordova is a 35 y.o. [id number] s/p RLTCS for breech presentation at [redacted]w[redacted]d.  No acute events overnight.  Pt denies problems with ambulating, voiding or po intake.  She denies nausea or vomiting.  Pain is well controlled.  She has had flatus.  Lochia Minimal.   Objective: Blood pressure 107/63, pulse (!) 54, temperature 97.9 F (36.6 C), temperature source Oral, resp. rate 19, height 5\' 3"  (1.6 m), weight 86.2 kg, last menstrual period 09/28/2019, SpO2 97 %, unknown if currently breastfeeding.  Physical Exam:  General: alert, cooperative and no distress Chest: no respiratory distress Heart:regular rate, distal pulses intact Abdomen: soft, nontender,  Uterine Fundus: firm, appropriately tender DVT Evaluation: No calf swelling or tenderness Extremities: Negative edema Skin:  incision clean/dry/intact  Recent Labs    07/01/20 2201 07/02/20 0517  HGB 12.5 10.9*  HCT 34.9* 31.7*    Assessment/Plan: Beth Cordova is a 35 y.o. 20 s/p RLTCS at [redacted]w[redacted]d   PPD# 1 - Doing well Contraception: BTL Feeding: Breast  Dispo: Plan for discharge tomorrow # Iron every other day # Needs circ   LOS: 1 day   Beth Cordova, [redacted]w[redacted]d, NP, CNM 07/02/2020, 9:58 AM

## 2020-07-02 NOTE — Op Note (Signed)
Beth Cordova PROCEDURE DATE: 07/02/2020  PREOPERATIVE DIAGNOSES: Intrauterine pregnancy at [redacted]w[redacted]d weeks gestation; malpresentation: breech; labor;  undesired fertility  POSTOPERATIVE DIAGNOSES: The same  PROCEDURE: Repeat Low Transverse Cesarean Section, Bilateral Tubal Sterilization using Filshie clipsmethod  SURGEON:  Dr. Jaynie Collins   ANESTHESIOLOGY TEAM: Anesthesiologist: Elmer Picker, MD CRNA: Armanda Heritage, CRNA  INDICATIONS: Beth Cordova is a 35 y.o. [id number] at [redacted]w[redacted]d here for cesarean section and bilateral tubal sterilization secondary to the indications listed under preoperative diagnoses; please see preoperative note for further details.  The risks of surgery were discussed with the patient including but were not limited to: bleeding which may require transfusion or reoperation; infection which may require antibiotics; injury to bowel, bladder, ureters or other surrounding organs; injury to the fetus; need for additional procedures including hysterectomy in the event of a life-threatening hemorrhage; formation of adhesions; placental abnormalities wth subsequent pregnancies; incisional problems; thromboembolic phenomenon and other postoperative/anesthesia complications.  Patient also desires permanent sterilization.  Other reversible forms of contraception were discussed with patient; she declines all other modalities.   Risks of sterilization procedure discussed with patient including but not limited to: risk of regret, permanence of method, bleeding, infection, injury to surrounding organs and need for additional procedures.  Failure risk of about 1% with increased risk of ectopic gestation if pregnancy occurs was also discussed with patient.  Also discussed possibility of post-tubal pain syndrome. The patient concurred with the proposed plan, giving informed written consent for the procedures.  FINDINGS:  Viable female infant in double footling breech presentation.   Hyperextended arms over head.  Mild difficulty during delivery given overextended arms. Apgars 3 and 9, arterial cord pH 7.26.  Clear amniotic fluid.  Intact placenta, three vessel cord.  Normal uterus, fallopian tubes and ovaries bilaterally. Fallopian tubes were sterilized bilaterally using Filshie clips.  Minimal intraperitoneal adhesive disease.   ANESTHESIA: Spinal ESTIMATED BLOOD LOSS: 450 ml SPECIMENS: Placenta sent to L&D COMPLICATIONS: None immediate  PROCEDURE IN DETAIL:  The patient preoperatively received intravenous antibiotics and had sequential compression devices applied to her lower extremities.   She was then taken to the operating room where spinal anesthesia was administered and was found to be adequate. She was then placed in a dorsal supine position with a leftward tilt, and prepped and draped in a sterile manner.  A foley catheter was placed into her bladder and attached to constant gravity.  After an adequate timeout was performed, a Pfannenstiel skin incision was made with scalpel over her preexisting scar and carried through to the underlying layer of fascia. The fascia was incised in the midline, and this incision was extended bilaterally using the Mayo scissors.  Kocher clamps were applied to the superior aspect of the fascial incision and the underlying rectus muscles were dissected off bluntly.  A similar process was carried out on the inferior aspect of the fascial incision. The rectus muscles were separated in the midline and the peritoneum was entered bluntly. The Alexis self-retaining retractor was introduced into the abdominal cavity.  Attention was turned to the lower uterine segment where a low transverse hysterotomy was made with a scalpel and extended bilaterally bluntly.  The infant was delivered with some difficulty, the cord was immediately clamped and cut, and the infant was handed over to the awaiting neonatology team. Uterine massage was then administered, and the  placenta delivered intact with a three-vessel cord. The uterus was then cleared of clots and debris.  The hysterotomy was closed with 0  Vicryl in a running locked fashion, and an imbricating layer was also placed with 0 Vicryl.  Attention was then turned to the fallopian tubes, and Filshie clips were placed about 3 cm from the cornua of both fallopian tubes, with care given to incorporate the underlying mesosalpinx on both sides, allowing for bilateral tubal sterilization. Excellent hemostasis was noted. The pelvis was cleared of all clot and debris. Hemostasis was confirmed on all surfaces.  The retractor was removed.  The peritoneum was closed with a 0 Vicryl interrupted stitches. The fascia was then closed using 0 PDS in a running fashion.  The subcutaneous layer was irrigated, reapproximated with 2-0 plain gut interrupted stitches, and the skin was closed with a 4-0 Vicryl subcuticular stitch. The patient tolerated the procedure well. Sponge, instrument and needle counts were correct x 3.  She was taken to the recovery room in stable condition.    Jaynie Collins, MD, FACOG Obstetrician & Gynecologist, [facility] for [employer], [facility]

## 2020-07-03 DIAGNOSIS — Z9851 Tubal ligation status: Secondary | ICD-10-CM

## 2020-07-03 MED ORDER — OXYCODONE HCL 5 MG PO TABS: 5.0000 mg | ORAL_TABLET | ORAL | 0 refills | Status: DC | PRN

## 2020-07-03 MED ORDER — IBUPROFEN 600 MG PO TABS: 600.0000 mg | ORAL_TABLET | Freq: Four times a day (QID) | ORAL | Status: DC | PRN

## 2020-07-03 MED ORDER — COCONUT OIL OIL: 1.0000 "application " | TOPICAL_OIL | 0 refills | Status: DC | PRN

## 2020-07-03 MED ORDER — ACETAMINOPHEN 500 MG PO TABS: 1000.0000 mg | ORAL_TABLET | Freq: Four times a day (QID) | ORAL | 2 refills | Status: DC | PRN

## 2020-07-03 NOTE — Lactation Note (Signed)
This note was copied from a baby's chart. Lactation Consultation Note  Patient Name: Beth Cordova KGYJE'H Date: 07/03/2020 Reason for consult: Follow-up assessment;Early term 37-38.6wks;Infant weight loss Age:35 hours/ P 3 / experienced  Baby hungry and mom latched with ease , baby still feeding at 19 mins with swallows. D/C education completed.  Per mom denies soreness, and feels good about the latching.  LC provided the [facility] procedure with resources.   Maternal Data Has patient been taught Hand Expression?:  (per mom good with hand expressing - exp) Does the patient have breastfeeding experience prior to this delivery?: Yes How long did the patient breastfeed?: x2 up to 2 1/2 years  Feeding Mother's Current Feeding Choice: Breast Milk  LATCH Score Latch: Grasps breast easily, tongue down, lips flanged, rhythmical sucking.  Audible Swallowing: Spontaneous and intermittent  Type of Nipple: Everted at rest and after stimulation  Comfort (Breast/Nipple): Soft / non-tender  Hold (Positioning): No assistance needed to correctly position infant at breast.  LATCH Score: 10   Lactation Tools Discussed/Used Tools: Pump Flange Size: 24;27 Breast pump type: Manual Pump Education: Milk Storage;Setup, frequency, and cleaning Reason for Pumping: PRN  Interventions Interventions: Breast feeding basics reviewed;Skin to skin;Hand pump;Education  Discharge Discharge Education: Engorgement and breast care;Warning signs for feeding baby Pump: Manual;Personal;DEBP WIC Program: Yes  Consult Status Consult Status: Complete Date: 07/03/20    Kathrin Greathouse 07/03/2020, 8:36 AM

## 2020-07-03 NOTE — Discharge Instructions (Signed)
-take tylenol 1000 mg every 6 hours as needed for pain, alternate with ibuprofen 600 mg every 6 hours -take oxycodone as needed if tylenol and ibuprofen aren't working -drink plenty of water to help with breastfeeding -continue prenatal vitamins while you are breastfeeding  Postpartum Care After Cesarean Delivery This sheet gives you information about how to care for yourself from the time you deliver your baby to up to 6-12 weeks after delivery (postpartum period). Your health care provider may also give you more specific instructions. If you have problems or questions, contact your health care provider. Follow these instructions at home: Medicines  Take over-the-counter and prescription medicines only as told by your health care provider.  If you were prescribed an antibiotic medicine, take it as told by your health care provider. Do not stop taking the antibiotic even if you start to feel better.  Ask your health care provider if the medicine prescribed to you: ? Requires you to avoid driving or using heavy machinery. ? Can cause constipation. You may need to take actions to prevent or treat constipation, such as:  Drink enough fluid to keep your urine pale yellow.  Take over-the-counter or prescription medicines.  Eat foods that are high in fiber, such as beans, whole grains, and fresh fruits and vegetables.  Limit foods that are high in fat and processed sugars, such as fried or sweet foods. Activity  Gradually return to your normal activities as told by your health care provider.  Avoid activities that take a lot of effort and energy (are strenuous) until approved by your health care provider. Walking at a slow to moderate pace is usually safe. Ask your health care provider what activities are safe for you. ? Do not lift anything that is heavier than your baby or 10 lb (4.5 kg) as told by your health care provider. ? Do not vacuum, climb stairs, or drive a car for as long as told  by your health care provider.  If possible, have someone help you at home until you are able to do your usual activities yourself.  Rest as much as possible. Try to rest or take naps while your baby is sleeping. Vaginal bleeding  It is normal to have vaginal bleeding (lochia) after delivery. Wear a sanitary pad to absorb vaginal bleeding and discharge. ? During the first week after delivery, the amount and appearance of lochia is often similar to a menstrual period. ? Over the next few weeks, it will gradually decrease to a dry, yellow-brown discharge. ? For most women, lochia stops completely by 4-6 weeks after delivery. Vaginal bleeding can vary from woman to woman.  Change your sanitary pads frequently. Watch for any changes in your flow, such as: ? A sudden increase in volume. ? A change in color. ? Large blood clots.  If you pass a blood clot, save it and call your health care provider to discuss. Do not flush blood clots down the toilet before you get instructions from your health care provider.  Do not use tampons or douches until your health care provider says this is safe.  If you are not breastfeeding, your period should return 6-8 weeks after delivery. If you are breastfeeding, your period may return anytime between 8 weeks after delivery and the time that you stop breastfeeding. Perineal care  If your C-section (Cesarean section) was unplanned, and you were allowed to labor and push before delivery, you may have pain, swelling, and discomfort of the tissue between your  vaginal opening and your anus (perineum). You may also have an incision in the tissue (episiotomy) or the tissue may have torn during delivery. Follow these instructions as told by your health care provider: ? Keep your perineum clean and dry as told by your health care provider. Use medicated pads and pain-relieving sprays and creams as directed. ? If you have an episiotomy or vaginal tear, check the area every  day for signs of infection. Check for:  Redness, swelling, or pain.  Fluid or blood.  Warmth.  Pus or a bad smell. ? You may be given a squirt bottle to use instead of wiping to clean the perineum area after you go to the bathroom. As you start healing, you may use the squirt bottle before wiping yourself. Make sure to wipe gently. ? To relieve pain caused by an episiotomy, vaginal tear, or hemorrhoids, try taking a warm sitz bath 2-3 times a day. A sitz bath is a warm water bath that is taken while you are sitting down. The water should only come up to your hips and should cover your buttocks.   Breast care  Within the first few days after delivery, your breasts may feel heavy, full, and uncomfortable (breast engorgement). You may also have milk leaking from your breasts. Your health care provider can suggest ways to help relieve breast discomfort. Breast engorgement should go away within a few days.  If you are breastfeeding: ? Wear a bra that supports your breasts and fits you well. ? Keep your nipples clean and dry. Apply creams and ointments as told by your health care provider. ? You may need to use breast pads to absorb milk leakage. ? You may have uterine contractions every time you breastfeed for several weeks after delivery. Uterine contractions help your uterus return to its normal size. ? If you have any problems with breastfeeding, work with your health care provider or a Advertising copywriter.  If you are not breastfeeding: ? Avoid touching your breasts as this can make your breasts produce more milk. ? Wear a well-fitting bra and use cold packs to help with swelling. ? Do not squeeze out (express) milk. This causes you to make more milk. Intimacy and sexuality  Ask your health care provider when you can engage in sexual activity. This may depend on your: ? Risk of infection. ? Healing rate. ? Comfort and desire to engage in sexual activity.  You are able to get pregnant  after delivery, even if you have not had your period. If desired, talk with your health care provider about methods of family planning or birth control (contraception). Lifestyle  Do not use any products that contain nicotine or tobacco, such as cigarettes, e-cigarettes, and chewing tobacco. If you need help quitting, ask your health care provider.  Do not drink alcohol, especially if you are breastfeeding. Eating and drinking  Drink enough fluid to keep your urine pale yellow.  Eat high-fiber foods every day. These may help prevent or relieve constipation. High-fiber foods include: ? Whole grain cereals and breads. ? Brown rice. ? Beans. ? Fresh fruits and vegetables.  Take your prenatal vitamins until your postpartum checkup or until your health care provider tells you it is okay to stop.   General instructions  Keep all follow-up visits for you and your baby as told by your health care provider. Most women visit their health care provider for a postpartum checkup within the first 3-6 weeks after delivery. Contact a health care  provider if you:  Feel unable to cope with the changes that a new baby brings to your life, and these feelings do not go away.  Feel unusually sad or worried.  Have breasts that are painful, hard, or turn red.  Have a fever.  Have trouble holding urine or keeping urine from leaking.  Have little or no interest in activities you used to enjoy.  Have not breastfed at all and you have not had a menstrual period for 12 weeks after delivery.  Have stopped breastfeeding and you have not had a menstrual period for 12 weeks after you stopped breastfeeding.  Have questions about caring for yourself or your baby.  Pass a blood clot from your vagina. Get help right away if you:  Have chest pain.  Have difficulty breathing.  Have sudden, severe leg pain.  Have severe pain or cramping in your abdomen.  Bleed from your vagina so much that you fill more  than one sanitary pad in one hour. Bleeding should not be heavier than your heaviest period.  Develop a severe headache.  Faint.  Have blurred vision or spots in your vision.  Have a bad-smelling vaginal discharge.  Have thoughts about hurting yourself or your baby. If you ever feel like you may hurt yourself or others, or have thoughts about taking your own life, get help right away. You can go to your nearest emergency department or call:  Your local emergency services (911 in the [place]  A suicide crisis helpline, such as the National Suicide Prevention Lifeline at 209-678-4718. This is open 24 hours a day. Summary  The period of time from when you deliver your baby to up to 6-12 weeks after delivery is called the postpartum period.  Gradually return to your normal activities as told by your health care provider.  Keep all follow-up visits for you and your baby as told by your health care provider. This information is not intended to replace advice given to you by your health care provider. Make sure you discuss any questions you have with your health care provider. Document Revised: 11/18/2017 Document Reviewed: 11/18/2017 Elsevier Patient Education  2021 [employer].

## 2020-07-04 NOTE — Discharge Summary (Addendum)
Postpartum Discharge Summary     Patient Name: Beth Cordova DOB: [birth year] MRN: [id number]  Date of admission: 07/01/2020 Delivery date:07/01/2020  Delivering provider: Verita Schneiders A  Date of discharge: 07/04/2020  Admitting diagnosis: S/P cesarean section [Z98.891] Intrauterine pregnancy: [redacted]w[redacted]d    Secondary diagnosis:  Principal Problem:   S/P cesarean section Active Problems:   Supervision of high risk pregnancy, antepartum   History of gestational diabetes mellitus (GDM)   Late prenatal care affecting pregnancy, antepartum   Prior pregnancy with fetal demise and current pregnancy   Grand multipara   Hepatitis C antibody positive in blood   Unwanted fertility   Chronic hepatitis C virus infection ([place]   History of bilateral tubal ligation  Additional problems: none     Discharge diagnosis: Term Pregnancy Delivered and Chronic Hep C                                              Post partum procedures:postpartum tubal ligation Augmentation: N/A Complications: None  Hospital course: Sceduled C/S   35y.o. yo [id number] admitted to the hospital 07/01/2020 for scheduled cesarean section with the following indication:Malpresentation and laboring.Delivery details are as follows:  Membrane Rupture Time/Date:  ,   Delivery Method:C-Section, Low Transverse  Details of operation can be found in separate operative note.  Patient had an uncomplicated postpartum course.  She is ambulating, tolerating a regular diet, passing flatus, and urinating well. Patient is discharged home in stable condition on  07/04/20        Newborn Data: Birth date:07/01/2020  Birth time:11:51 PM  Gender:Female  Living status:Living  Apgars:3 ,9  Weight:3095 g     Magnesium Sulfate received: No BMZ received: No Rhophylac:N/A MMR:N/A T-DaP:Given prenatally Flu: No Transfusion:No  Physical exam  Vitals:   07/03/20 0616 07/03/20 1343 07/03/20 2005 07/04/20 0425  BP: 107/63 105/71  106/62 110/76  Pulse: 71 78 65 65  Resp: _0 Temp: 97.7 F (36.5 C)  98.2 F (36.8 C) 97.6 F (36.4 C)  TempSrc: Oral  Oral Oral  SpO2: 97%  99% 100%  Weight:      Height:       General: alert, cooperative and no distress Lochia: appropriate Uterine Fundus: firm Incision: Dressing is clean, dry, and intact DVT Evaluation: No evidence of DVT seen on physical exam. Labs: Lab Results  Component Value Date   WBC 13.1 (H) 07/02/2020   HGB 10.9 (L) 07/02/2020   HCT 31.7 (L) 07/02/2020   MCV 95.2 07/02/2020   PLT 147 (L) 07/02/2020   CMP Latest Ref Rng & Units 07/02/2020  Creatinine 0.44 - 1.00 mg/dL 0.72   Edinburgh Score: Edinburgh Postnatal Depression Scale Screening Tool 07/02/2020  I have been able to laugh and see the funny side of things. 0  I have looked forward with enjoyment to things. 0  I have blamed myself unnecessarily when things went wrong. 2  I have been anxious or worried for no good reason. 1  I have felt scared or panicky for no good reason. 1  Things have been getting on top of me. 1  I have been so unhappy that I have had difficulty sleeping. 0  I have felt sad or miserable. 1  I have been so unhappy that I have been crying. 1  The thought  of harming myself has occurred to me. 0  Edinburgh Postnatal Depression Scale Total 7     After visit meds:  Allergies as of 07/04/2020   No Known Allergies     Medication List    TAKE these medications   acetaminophen 500 MG tablet Commonly known as: TYLENOL Take 2 tablets (1,000 mg total) by mouth every 6 (six) hours as needed.   Blood Pressure Monitor Automat Devi 1 Device by Does not apply route daily. Automatic blood pressure cuff regular size. To monitor blood pressure regularly at home. ICD-10 code: O09.90   calcium carbonate 500 MG chewable tablet Commonly known as: TUMS - dosed in mg elemental calcium Chew 2 tablets by mouth 2 (two) times daily as needed for indigestion or heartburn.    coconut oil Oil Apply 1 application topically as needed.   Gojji Weight Scale Misc 1 Device by Does not apply route daily as needed. To weight self daily as needed at home. ICD-10 code: O09.90   ibuprofen 600 MG tablet Commonly known as: ADVIL Take 1 tablet (600 mg total) by mouth every 6 (six) hours as needed.   oxyCODONE 5 MG immediate release tablet Commonly known as: Oxy IR/ROXICODONE Take 1-2 tablets (5-10 mg total) by mouth every 4 (four) hours as needed for moderate pain.   prenatal vitamin w/FE, FA 27-1 MG Tabs tablet Take 1 tablet by mouth daily at 12 noon.        Discharge home in stable condition Infant Feeding: Breast Infant Disposition:home with mother Discharge instruction: per After Visit Summary and Postpartum booklet. Activity: Advance as tolerated. Pelvic rest for 6 weeks.  Diet: routine diet Future Appointments: Future Appointments  Date Time Provider [place]  07/20/2020 10:10 AM Julianne Handler, CNM [facility]  08/03/2020  9:50 AM Julianne Handler, CNM [facility]    Message sent to [facility] 07/03/20 by Sylvester Harder. Patient needs postpartum appt in 4-6 weeks. Please also schedule 1 week incision check. BTL completed Uncomplicated pregnancy, cesarean section.   07/04/2020 Alcus Dad, MD  GME ATTESTATION:  I saw and evaluated the patient. I agree with the findings and the plan of care as documented in the resident's note.  Arrie Senate, MD OB Fellow, [place] [facility] [place] 07/04/2020 7:46 AM

## 2020-07-05 ENCOUNTER — Encounter: Payer: Self-pay | Admitting: *Deleted

## 2020-07-05 ENCOUNTER — Encounter: Payer: BC Managed Care – PPO | Admitting: Obstetrics and Gynecology

## 2020-07-20 ENCOUNTER — Ambulatory Visit (INDEPENDENT_AMBULATORY_CARE_PROVIDER_SITE_OTHER): Payer: BC Managed Care – PPO | Admitting: Certified Nurse Midwife

## 2020-07-20 ENCOUNTER — Encounter: Payer: Self-pay | Admitting: Certified Nurse Midwife

## 2020-07-20 ENCOUNTER — Other Ambulatory Visit: Payer: Self-pay

## 2020-07-20 VITALS — BP 107/78 | HR 65 | Ht 63.0 in | Wt 169.0 lb

## 2020-07-20 DIAGNOSIS — Z98891 History of uterine scar from previous surgery: Secondary | ICD-10-CM

## 2020-07-20 NOTE — Progress Notes (Signed)
Subjective:    Beth Cordova is a 35 y.o. female who presents for a incision check. She is 2 weeks postpartum following a low cervical transverse Cesarean section. She denies redness, drainage, or edema of incision. Her pain is minimal.  Review of Systems Pertinent items are noted in HPI.   Objective:   BP 107/78   Pulse 65   Ht 5\' 3"  (1.6 m)   Wt 169 lb (76.7 kg)   LMP 09/28/2019 (Exact Date) Comment: IUD removed 2 months after delivery of last child  Breastfeeding Yes   BMI 29.94 kg/m   General: alert, cooperative and no distress  Lungs:  Regular WOB  Heart:  Regular rate  Skin:  Low transverse abd incision well healed and approximated, no edema, erythema, or drainage.   Assessment:  S/p Cesarean Section Incision check  Plan:  Return for postpartum visit as scheduled    09/30/2019, Donette Larry  07/20/2020 10:33 AM

## 2020-08-03 ENCOUNTER — Other Ambulatory Visit (HOSPITAL_COMMUNITY)
Admission: RE | Admit: 2020-08-03 | Discharge: 2020-08-03 | Disposition: A | Discharge status: Home / Self Care | Payer: BC Managed Care – PPO | Source: Ambulatory Visit | Attending: Certified Nurse Midwife | Admitting: Certified Nurse Midwife

## 2020-08-03 ENCOUNTER — Other Ambulatory Visit: Payer: Self-pay

## 2020-08-03 ENCOUNTER — Ambulatory Visit (INDEPENDENT_AMBULATORY_CARE_PROVIDER_SITE_OTHER): Payer: BC Managed Care – PPO | Admitting: Certified Nurse Midwife

## 2020-08-03 ENCOUNTER — Encounter: Payer: Self-pay | Admitting: Certified Nurse Midwife

## 2020-08-03 DIAGNOSIS — R87610 Atypical squamous cells of undetermined significance on cytologic smear of cervix (ASC-US): Secondary | ICD-10-CM | POA: Insufficient documentation

## 2020-08-03 DIAGNOSIS — M6208 Separation of muscle (nontraumatic), other site: Secondary | ICD-10-CM

## 2020-08-03 NOTE — Progress Notes (Signed)
Post Partum Visit Note  Verlyn Dannenberg is a 35 y.o. [phone] female who presents for a postpartum visit. She is 4 postpartum following a primary cesarean section.  I have fully reviewed the prenatal and intrapartum course. The delivery was at [redacted]w[redacted]d gestational weeks.  Anesthesia: epidural. Postpartum course has been unremarkable. Baby is doing wellunremarkable. Baby is feeding by breast. Bleeding no bleeding. Bowel function is normal. Bladder function is normal. Patient is sexually active. Contraception method is tubal ligation. Postpartum depression screening: negative. Reports ?abdominal hernia since 2014, has pain at times.   The pregnancy intention screening data noted above was reviewed. Potential methods of contraception were discussed. The patient elected to proceed with Female Sterilization.    Edinburgh Postnatal Depression Scale - 08/03/20 1007      Edinburgh Postnatal Depression Scale:  In the Past 7 Days   I have been able to laugh and see the funny side of things. 0    I have looked forward with enjoyment to things. 0    I have blamed myself unnecessarily when things went wrong. 1    I have been anxious or worried for no good reason. 1    I have felt scared or panicky for no good reason. 0    Things have been getting on top of me. 1    I have been so unhappy that I have had difficulty sleeping. 0    I have felt sad or miserable. 0    I have been so unhappy that I have been crying. 1    The thought of harming myself has occurred to me. 0    Edinburgh Postnatal Depression Scale Total 4           Health Maintenance Due  Topic Date Due  . COVID-19 Vaccine (1) Never done  . PAP SMEAR-Modifier  05/12/2020    The following portions of the patient's history were reviewed and updated as appropriate: allergies, current medications, past family history, past medical history, past social history, past surgical history and problem list.  Review of Systems Pertinent items are  noted in HPI.  Objective:  BP 97/66   Pulse 72   Resp 16   Ht 5\' 3"  (1.6 m)   Wt 166 lb (75.3 kg)   LMP 09/28/2019 (Exact Date) Comment: IUD removed 2 months after delivery of last child  Breastfeeding Yes   BMI 29.41 kg/m    General:  alert, cooperative and no distress   Breasts:  not indicated  Lungs: RWOB  Heart:  Reg rate  Abdomen: soft, non-tender; bowel sounds normal; no masses,  no organomegaly; mild 1.5 cm diastasis superior to umbilicus, no hernia; low transverse incision, c/d/i, healing well  Wound well approximated incision  GU exam:  not indicated       Assessment:   1. Postpartum examination following cesarean delivery     Plan:   Essential components of care per ACOG recommendations:  1.  Mood and well being: Patient with negative depression screening today. Reviewed local resources for support.  - Patient does not use tobacco. If using tobacco we discussed reduction and for recently cessation risk of relapse - hx of drug use? No   If yes, discussed support systems and outpatient/inpatient treatment options.   2. Infant care and feeding:  -Patient currently breastmilk feeding? Yes If breastmilk feeding discussed return to work and pumping. If needed, patient was provided letter for work to allow for every 2-3 hr pumping breaks, and  to be granted a private location to express breastmilk and refrigerated area to store breastmilk. Reviewed importance of draining breast regularly to support lactation. -Social determinants of health (SDOH) reviewed in EPIC. No concerns  3. Sexuality, contraception and birth spacing - Patient does not want a pregnancy in the next year.  Desired family size is 6 children.  - Reviewed forms of contraception in tiered fashion. Patient desired bilateral tubal ligation, done at time of CS.   - Discussed birth spacing of 18 months  4. Sleep and fatigue -Encouraged family/partner/community support of 4 hrs of uninterrupted sleep to help  with mood and fatigue  5. Physical Recovery  - Discussed patients delivery and complications - Patient had a C-section.  - Patient has urinary incontinence? No Patient was referred to pelvic floor PT  - Patient is safe to resume physical and sexual activity  6.  Health Maintenance - HM due items addressed Yes - Last pap smear done 2019 and was normal with negative HPV. Pap smear done at today's visit.  Mammogram due at age 39  7. Chronic Disease - Hep C - PCP follow up annually  8. Diastasis recti - small, 1.5 cm - should improve with recovery - no hernia present - f/u PCP if pain persists   Donette Larry, CNM

## 2020-08-07 LAB — CYTOLOGY - PAP
Comment: NEGATIVE
Diagnosis: UNDETERMINED — AB
High risk HPV: NEGATIVE

## 2020-10-19 DIAGNOSIS — F419 Anxiety disorder, unspecified: Secondary | ICD-10-CM | POA: Diagnosis not present

## 2020-11-05 DIAGNOSIS — F419 Anxiety disorder, unspecified: Secondary | ICD-10-CM | POA: Diagnosis not present

## 2020-11-12 DIAGNOSIS — F419 Anxiety disorder, unspecified: Secondary | ICD-10-CM | POA: Diagnosis not present

## 2020-11-19 ENCOUNTER — Emergency Department (HOSPITAL_COMMUNITY): Payer: BC Managed Care – PPO

## 2020-11-19 ENCOUNTER — Observation Stay (HOSPITAL_COMMUNITY)
Admission: EM | Admit: 2020-11-19 | Discharge: 2020-11-21 | Disposition: A | Discharge status: Home / Self Care | Payer: BC Managed Care – PPO | Attending: Surgery | Admitting: Surgery

## 2020-11-19 ENCOUNTER — Other Ambulatory Visit: Payer: Self-pay

## 2020-11-19 ENCOUNTER — Encounter (HOSPITAL_COMMUNITY): Payer: Self-pay

## 2020-11-19 DIAGNOSIS — K439 Ventral hernia without obstruction or gangrene: Secondary | ICD-10-CM

## 2020-11-19 DIAGNOSIS — R001 Bradycardia, unspecified: Secondary | ICD-10-CM | POA: Diagnosis not present

## 2020-11-19 DIAGNOSIS — K460 Unspecified abdominal hernia with obstruction, without gangrene: Secondary | ICD-10-CM | POA: Diagnosis present

## 2020-11-19 DIAGNOSIS — I10 Essential (primary) hypertension: Secondary | ICD-10-CM | POA: Diagnosis not present

## 2020-11-19 DIAGNOSIS — Z87891 Personal history of nicotine dependence: Secondary | ICD-10-CM | POA: Diagnosis not present

## 2020-11-19 DIAGNOSIS — Z20822 Contact with and (suspected) exposure to covid-19: Secondary | ICD-10-CM | POA: Insufficient documentation

## 2020-11-19 DIAGNOSIS — K436 Other and unspecified ventral hernia with obstruction, without gangrene: Principal | ICD-10-CM | POA: Insufficient documentation

## 2020-11-19 DIAGNOSIS — R1084 Generalized abdominal pain: Secondary | ICD-10-CM | POA: Diagnosis not present

## 2020-11-19 DIAGNOSIS — I959 Hypotension, unspecified: Secondary | ICD-10-CM | POA: Diagnosis not present

## 2020-11-19 DIAGNOSIS — F419 Anxiety disorder, unspecified: Secondary | ICD-10-CM | POA: Diagnosis not present

## 2020-11-19 DIAGNOSIS — K769 Liver disease, unspecified: Secondary | ICD-10-CM | POA: Diagnosis not present

## 2020-11-19 LAB — CBC WITH DIFFERENTIAL/PLATELET
Abs Immature Granulocytes: 0.05 10*3/uL (ref 0.00–0.07)
Basophils Absolute: 0 10*3/uL (ref 0.0–0.1)
Basophils Relative: 0 %
Eosinophils Absolute: 0.1 10*3/uL (ref 0.0–0.5)
Eosinophils Relative: 1 %
HCT: 38.6 % (ref 36.0–46.0)
Hemoglobin: 13.1 g/dL (ref 12.0–15.0)
Immature Granulocytes: 0 %
Lymphocytes Relative: 14 %
Lymphs Abs: 1.7 10*3/uL (ref 0.7–4.0)
MCH: 32.8 pg (ref 26.0–34.0)
MCHC: 33.9 g/dL (ref 30.0–36.0)
MCV: 96.7 fL (ref 80.0–100.0)
Monocytes Absolute: 0.3 10*3/uL (ref 0.1–1.0)
Monocytes Relative: 3 %
Neutro Abs: 9.4 10*3/uL — ABNORMAL HIGH (ref 1.7–7.7)
Neutrophils Relative %: 82 %
Platelets: 259 10*3/uL (ref 150–400)
RBC: 3.99 MIL/uL (ref 3.87–5.11)
RDW: 12.2 % (ref 11.5–15.5)
WBC: 11.6 10*3/uL — ABNORMAL HIGH (ref 4.0–10.5)
nRBC: 0 % (ref 0.0–0.2)

## 2020-11-19 LAB — I-STAT BETA HCG BLOOD, ED (MC, WL, AP ONLY): I-stat hCG, quantitative: <5 m[IU]/mL (ref <5)

## 2020-11-19 LAB — BASIC METABOLIC PANEL
Anion gap: 12 (ref 5–15)
BUN: 11 mg/dL (ref 6–20)
CO2: 19 mmol/L — ABNORMAL LOW (ref 22–32)
Calcium: 9 mg/dL (ref 8.9–10.3)
Chloride: 108 mmol/L (ref 98–111)
Creatinine, Ser: 0.85 mg/dL (ref 0.44–1.00)
GFR, Estimated: >60 mL/min (ref >60)
Glucose, Bld: 110 mg/dL — ABNORMAL HIGH (ref 70–99)
Potassium: 3.2 mmol/L — ABNORMAL LOW (ref 3.5–5.1)
Sodium: 139 mmol/L (ref 135–145)

## 2020-11-19 LAB — RESP PANEL BY RT-PCR (FLU A&B, COVID) ARPGX2
Influenza A by PCR: NEGATIVE
Influenza B by PCR: NEGATIVE
SARS Coronavirus 2 by RT PCR: NEGATIVE

## 2020-11-19 MED ORDER — IOHEXOL 300 MG/ML  SOLN
100.0000 mL | Freq: Once | INTRAMUSCULAR | Status: AC | PRN
  Administered 2020-11-19: 100 mL via INTRAVENOUS

## 2020-11-19 MED ORDER — KETOROLAC TROMETHAMINE 30 MG/ML IJ SOLN
30.0000 mg | Freq: Four times a day (QID) | INTRAMUSCULAR | Status: DC | PRN
  Administered 2020-11-19 – 2020-11-20 (×2): 30 mg via INTRAVENOUS
  Filled 2020-11-19 (×2): qty 1

## 2020-11-19 MED ORDER — CEFAZOLIN SODIUM-DEXTROSE 2-4 GM/100ML-% IV SOLN
2.0000 g | INTRAVENOUS | Status: AC
  Administered 2020-11-20: 2 g via INTRAVENOUS
  Filled 2020-11-19 (×2): qty 100

## 2020-11-19 MED ORDER — HYDROMORPHONE HCL 1 MG/ML IJ SOLN
0.5000 mg | INTRAMUSCULAR | Status: DC | PRN
  Administered 2020-11-20 – 2020-11-21 (×3): 0.5 mg via INTRAVENOUS
  Filled 2020-11-19 (×4): qty 0.5

## 2020-11-19 MED ORDER — ONDANSETRON HCL 4 MG/2ML IJ SOLN: 4.0000 mg | Freq: Four times a day (QID) | INTRAMUSCULAR | Status: DC | PRN

## 2020-11-19 MED ORDER — HYDROMORPHONE HCL 1 MG/ML IJ SOLN
1.0000 mg | Freq: Once | INTRAMUSCULAR | Status: AC
Start: 2020-11-19 — End: 2020-11-19
  Administered 2020-11-19: 1 mg via INTRAVENOUS
  Filled 2020-11-19: qty 1

## 2020-11-19 MED ORDER — ONDANSETRON 4 MG PO TBDP: 4.0000 mg | ORAL_TABLET | Freq: Four times a day (QID) | ORAL | Status: DC | PRN

## 2020-11-19 MED ORDER — DEXTROSE-NACL 5-0.9 % IV SOLN: INTRAVENOUS | Status: DC

## 2020-11-19 MED ORDER — ONDANSETRON HCL 4 MG/2ML IJ SOLN
4.0000 mg | Freq: Once | INTRAMUSCULAR | Status: AC
  Administered 2020-11-19: 4 mg via INTRAVENOUS
  Filled 2020-11-19: qty 2

## 2020-11-19 MED ORDER — HYDROMORPHONE HCL 1 MG/ML IJ SOLN
1.0000 mg | Freq: Once | INTRAMUSCULAR | Status: AC
  Administered 2020-11-19: 1 mg via INTRAVENOUS
  Filled 2020-11-19: qty 1

## 2020-11-19 NOTE — H&P (Signed)
Beth Cordova is an 35 y.o. female.   Chief Complaint: Incarcerated ventral hernia HPI: Patient is a 35 year old female, with a 5+ year history of a ventral hernia that she has known about.  Patient states that with her most recent pregnancies she does at the hernia protruded.  She states that she has had no symptoms of incarceration in the past.  This is her first episode.  She states this began today at 1 PM.  She states that this was significantly painful with associated nausea vomiting.  Patient came to the ER for further evaluation.  Upon evaluation the ER she underwent CT scan.  CT scan was significant for incarcerated loop of small bowel within the hernia.  Patient with minimal leukocytosis.  I did review the CT scan personally and labs.  General surgery was consulted for further evaluation and management.  Past Medical History:  Diagnosis Date   Bowel dysfunction    States she has to eat 5 small meals a day   Bowel obstruction (HCC)    Hep C w/o coma, chronic (HCC)    History of preterm delivery 12/23/2016   20 week delivery at home PPROM then delivery.    Ovarian cyst    Trichomonas infection    UTI (urinary tract infection)     Past Surgical History:  Procedure Laterality Date   CESAREAN SECTION     CESAREAN SECTION N/A 07/01/2020   Procedure: CESAREAN SECTION;  Surgeon: Tereso NewcomerAnyanwu, Ugonna A, MD;  Location: [facility] LD [facility]S;  Service: Obstetrics;  Laterality: N/A;   NO PAST SURGERIES     WISDOM TOOTH EXTRACTION      Family History  Problem Relation Age of Onset   Hypertension Father    Asthma Brother    Cancer Maternal Grandmother        bone   Hypertension Maternal Grandmother    Stroke Maternal Grandmother    Diabetes Paternal Grandmother    Diabetes Paternal Grandfather    Social History:  reports that she has quit smoking. She has never used smokeless tobacco. She reports that she does not drink alcohol and does not use drugs.  Allergies: No Known Allergies  (Not in  a hospital admission)   Results for orders placed or performed during the hospital encounter of 11/19/20 (from the past 48 hour(s))  Resp Panel by RT-PCR (Flu A&B, Covid) Nasopharyngeal Swab     Status: None   Collection Time: 11/19/20  3:25 PM   Specimen: Nasopharyngeal Swab; Nasopharyngeal(NP) swabs in vial transport medium  Result Value Ref Range   SARS Coronavirus 2 by RT PCR NEGATIVE NEGATIVE    Comment: (NOTE) SARS-CoV-2 target nucleic acids are NOT DETECTED.  The SARS-CoV-2 RNA is generally detectable in upper respiratory specimens during the acute phase of infection. The lowest concentration of SARS-CoV-2 viral copies this assay can detect is 138 copies/mL. A negative result does not preclude SARS-Cov-2 infection and should not be used as the sole basis for treatment or other patient management decisions. A negative result may occur with  improper specimen collection/handling, submission of specimen other than nasopharyngeal swab, presence of viral mutation(s) within the areas targeted by this assay, and inadequate number of viral copies(<138 copies/mL). A negative result must be combined with clinical observations, patient history, and epidemiological information. The expected result is Negative.  Fact Sheet for Patients:  BloggerCourse.comhttps://www.fda.gov/media/152166/download  Fact Sheet for Healthcare Providers:  SeriousBroker.ithttps://www.fda.gov/media/152162/download  This test is no t yet approved or cleared by the [place] FDA and  has been authorized for detection and/or diagnosis of SARS-CoV-2 by FDA under an Emergency Use Authorization (EUA). This EUA will remain  in effect (meaning this test can be used) for the duration of the COVID-19 declaration under Section 564(b)(1) of the Act, 21 U.S.C.section 360bbb-3(b)(1), unless the authorization is terminated  or revoked sooner.       Influenza A by PCR NEGATIVE NEGATIVE   Influenza B by PCR NEGATIVE NEGATIVE    Comment: (NOTE) The  Xpert Xpress SARS-CoV-2/FLU/RSV plus assay is intended as an aid in the diagnosis of influenza from Nasopharyngeal swab specimens and should not be used as a sole basis for treatment. Nasal washings and aspirates are unacceptable for Xpert Xpress SARS-CoV-2/FLU/RSV testing.  Fact Sheet for Patients: BloggerCourse.com  Fact Sheet for Healthcare Providers: SeriousBroker.it  This test is not yet approved or cleared by the [place] FDA and has been authorized for detection and/or diagnosis of SARS-CoV-2 by FDA under an Emergency Use Authorization (EUA). This EUA will remain in effect (meaning this test can be used) for the duration of the COVID-19 declaration under Section 564(b)(1) of the Act, 21 U.S.C. section 360bbb-3(b)(1), unless the authorization is terminated or revoked.  Performed at [facility], [address] [place], [place] [address]   CBC with Differential/Platelet     Status: Abnormal   Collection Time: 11/19/20  4:29 PM  Result Value Ref Range   WBC 11.6 (H) 4.0 - 10.5 K/uL   RBC 3.99 3.87 - 5.11 MIL/uL   Hemoglobin 13.1 12.0 - 15.0 g/dL   HCT 97.3 53.2 - 99.2 %   MCV 96.7 80.0 - 100.0 fL   MCH 32.8 26.0 - 34.0 pg   MCHC 33.9 30.0 - 36.0 g/dL   RDW 42.6 83.4 - 19.6 %   Platelets 259 150 - 400 K/uL   nRBC 0.0 0.0 - 0.2 %   Neutrophils Relative % 82 %   Neutro Abs 9.4 (H) 1.7 - 7.7 K/uL   Lymphocytes Relative 14 %   Lymphs Abs 1.7 0.7 - 4.0 K/uL   Monocytes Relative 3 %   Monocytes Absolute 0.3 0.1 - 1.0 K/uL   Eosinophils Relative 1 %   Eosinophils Absolute 0.1 0.0 - 0.5 K/uL   Basophils Relative 0 %   Basophils Absolute 0.0 0.0 - 0.1 K/uL   Immature Granulocytes 0 %   Abs Immature Granulocytes 0.05 0.00 - 0.07 K/uL    Comment: Performed at [facility], [address] [place], [place] [address]  Basic metabolic panel     Status: Abnormal   Collection Time: 11/19/20  4:29 PM  Result Value  Ref Range   Sodium 139 135 - 145 mmol/L   Potassium 3.2 (L) 3.5 - 5.1 mmol/L   Chloride 108 98 - 111 mmol/L   CO2 19 (L) 22 - 32 mmol/L   Glucose, Bld 110 (H) 70 - 99 mg/dL    Comment: Glucose reference range applies only to samples taken after fasting for at least 8 hours.   BUN 11 6 - 20 mg/dL   Creatinine, Ser 9.89 0.44 - 1.00 mg/dL   Calcium 9.0 8.9 - 21.1 mg/dL   GFR, Estimated >94 >17 mL/min    Comment: (NOTE) Calculated using the CKD-EPI Creatinine Equation (2021)    Anion gap 12 5 - 15    Comment: Performed at [facility], [address], [place], [place] [address]  I-Stat Beta hCG blood, ED ([facility], [facility], [facility] only)  Status: None   Collection Time: 11/19/20  5:42 PM  Result Value Ref Range   I-stat hCG, quantitative <5.0 <5 mIU/mL   Comment 3            Comment:   GEST. AGE      CONC.  (mIU/mL)   <=1 WEEK        5 - 50     2 WEEKS       50 - 500     3 WEEKS       100 - 10,000     4 WEEKS     1,000 - 30,000        FEMALE AND NON-PREGNANT FEMALE:     LESS THAN 5 mIU/mL    CT ABDOMEN PELVIS W CONTRAST  Result Date: 11/19/2020 CLINICAL DATA:  Concern for right-sided hernia, felt pop after picking up child EXAM: CT ABDOMEN AND PELVIS WITH CONTRAST TECHNIQUE: Multidetector CT imaging of the abdomen and pelvis was performed using the standard protocol following bolus administration of intravenous contrast. CONTRAST:  OMNIPAQUE IOHEXOL 300 MG/ML  SOLN COMPARISON:  None. FINDINGS: Lower chest: No acute abnormality. Hepatobiliary: Hypodense 2.4 cm lesion in the posterior aspect of the right lobe of the liver on image 18/3 which appears to demonstrate some peripheral nodular discontinuous enhancement best seen on coronal image 66. Similar appearing lesion in the inferior aspect of the right lobe of the liver measuring 10 mm on image 29/3 and additional similar appearing lesion in the inferior aspect of the left lobe of the liver measuring 8 mm on image 29/3. Gallbladder is  unremarkable. No biliary ductal dilation. Pancreas: Within normal limits. Spleen: Within normal limits. Adrenals/Urinary Tract: Adrenal glands are unremarkable. Kidneys are normal, without renal calculi, solid enhancing lesion, or hydronephrosis. Bladder is unremarkable for degree of distension. Stomach/Bowel: Stomach is unremarkable. No pathologic dilation of small bowel. Ventral hernia containing fat and prominent fluid-filled small bowel measuring up to 2.2 cm with an aperture width of 15 mm. Normal appendix. No evidence of acute large bowel inflammation. Vascular/Lymphatic: No significant vascular findings are present. No pathologically enlarged abdominal or pelvic lymph nodes. Reproductive: Uterus and bilateral adnexa are unremarkable. Tampon in the vagina. Other: Anterior abdominal wall hernia, described below above. No abdominopelvic ascites. Musculoskeletal: No acute osseous abnormality. IMPRESSION: 1. Ventral hernia containing fat and a prominent fluid-filled loop of small bowel measuring up to 2.2 cm with a aperture width of 15 mm. No pathologic dilation of small bowel, however early obstruction not excluded on this examination. Recommend clinical correlation for reducibility and surgical consult. 2. 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 Consider more definitive characterization with nonemergent hepatic protocol MRI with and without contrast. Electronically Signed   By: Maudry Mayhew MD   On: 11/19/2020 19:24    Review of Systems  Constitutional:  Negative for chills and fever.  HENT:  Negative for ear discharge, hearing loss and sore throat.   Eyes:  Negative for discharge.  Respiratory:  Negative for cough and shortness of breath.   Cardiovascular:  Negative for chest pain and leg swelling.  Gastrointestinal:  Positive for abdominal pain. Negative for constipation, diarrhea, nausea and  vomiting.  Musculoskeletal:  Negative for myalgias and neck pain.  Skin:  Negative for rash.  Allergic/Immunologic: Negative for environmental allergies.  Neurological:  Negative for dizziness and seizures.  Hematological:  Does not bruise/bleed easily.  Psychiatric/Behavioral:  Negative for  suicidal ideas.   All other systems reviewed and are negative.  Blood pressure 118/74, pulse (!) 51, temperature 98.1 F (36.7 C), temperature source Oral, resp. rate 16, height 5\' 3"  (1.6 m), weight 74.8 kg, SpO2 99 %, currently breastfeeding. Physical Exam Constitutional:      Appearance: She is well-developed.     Comments: Conversant No acute distress  HENT:     Head: Normocephalic and atraumatic.  Eyes:     General: Lids are normal. No scleral icterus.    Pupils: Pupils are equal, round, and reactive to light.     Comments: Pupils are equal round and reactive No lid lag Moist conjunctiva  Neck:     Thyroid: No thyromegaly.     Trachea: No tracheal tenderness.     Comments: No cervical lymphadenopathy Cardiovascular:     Rate and Rhythm: Normal rate and regular rhythm.     Heart sounds: No murmur heard. Pulmonary:     Effort: Pulmonary effort is normal.     Breath sounds: Normal breath sounds. No wheezing or rales.  Abdominal:     Tenderness: There is no abdominal tenderness.     Hernia: A hernia is present. Hernia is present in the ventral area.    Musculoskeletal:     Cervical back: Normal range of motion and neck supple.  Skin:    General: Skin is warm.     Findings: No rash.     Nails: There is no clubbing.     Comments: Normal skin turgor  Neurological:     Mental Status: She is alert and oriented to person, place, and time.     Comments: Normal gait and station  Psychiatric:        Mood and Affect: Mood normal.        Thought Content: Thought content normal.        Judgment: Judgment normal.     Comments: Appropriate affect     Assessment/Pla 35 year old female  with incarcerated ventral hernia. I was able to reduce the hernia at bedside. Patient would like to be admitted for repair as she had significant pain and discomfort. 1.  I will admit her, n.p.o. after midnight. 2.  I will discuss her case with Dr. 31 in regards to laparoscopic versus open hernia repair with mesh.    Dwain Sarna, MD 11/19/2020, 7:51 PM

## 2020-11-19 NOTE — ED Notes (Signed)
Attempted to call report to receiving nurse. Nurse unavailable at this time, will call back shortly.

## 2020-11-19 NOTE — ED Provider Notes (Signed)
[facility] EMERGENCY DEPARTMENT Provider Note   CSN: [id number] Arrival date & time: 11/19/20  1457     History Chief Complaint  Patient presents with   Abdominal Pain    Beth Cordova is a 35 y.o. female.  The history is provided by the patient. No language interpreter was used.  Abdominal Pain Pain location:  Generalized Pain quality: aching   Pain radiates to:  Does not radiate Pain severity:  Moderate Onset quality:  Sudden Timing:  Constant Progression:  Worsening Chronicity:  New Relieved by:  Nothing Worsened by:  Nothing Ineffective treatments:  None tried Associated symptoms: no diarrhea     Pt reports she was lifting one of her children and had a pop in her abdomen.  Pt reports she developed a hernia and now has severe pain   Past Medical History:  Diagnosis Date   Bowel dysfunction    States she has to eat 5 small meals a day   Bowel obstruction (HCC)    Hep C w/o coma, chronic (HCC)    History of preterm delivery 12/23/2016   20 week delivery at home PPROM then delivery.    Ovarian cyst    Trichomonas infection    UTI (urinary tract infection)     Patient Active Problem List   Diagnosis Date Noted   Hepatitis C antibody positive in blood 04/05/2020   Chronic hepatitis C virus infection (HCC) 10/20/2018    Past Surgical History:  Procedure Laterality Date   CESAREAN SECTION     CESAREAN SECTION N/A 07/01/2020   Procedure: CESAREAN SECTION;  Surgeon: Tereso Newcomer, MD;  Location: [facility] LD ORS;  Service: Obstetrics;  Laterality: N/A;   NO PAST SURGERIES     WISDOM TOOTH EXTRACTION       OB History     Gravida  7   Para  7   Term  6   Preterm  1   AB  0   Living  6      SAB  0   IAB      Ectopic      Multiple  0   Live Births  7           Family History  Problem Relation Age of Onset   Hypertension Father    Asthma Brother    Cancer Maternal Grandmother        bone   Hypertension Maternal  Grandmother    Stroke Maternal Grandmother    Diabetes Paternal Grandmother    Diabetes Paternal Grandfather     Social History   Tobacco Use   Smoking status: Former   Smokeless tobacco: Never   Tobacco comments:    quit Aug 2018  Vaping Use   Vaping Use: Some days  Substance Use Topics   Alcohol use: No   Drug use: No    Home Medications Prior to Admission medications   Medication Sig Start Date End Date Taking? Authorizing Provider  acetaminophen (TYLENOL) 500 MG tablet Take 2 tablets (1,000 mg total) by mouth every 6 (six) hours as needed. Patient not taking: Reported on 07/20/2020 07/03/20 07/03/21  Alric Seton, MD  Blood Pressure Monitoring (BLOOD PRESSURE MONITOR AUTOMAT) DEVI 1 Device by Does not apply route daily. Automatic blood pressure cuff regular size. To monitor blood pressure regularly at home. ICD-10 code: O54.90 Patient not taking: Reported on 07/20/2020 01/17/20   Raelyn Mora, CNM  calcium carbonate (TUMS - DOSED IN MG ELEMENTAL CALCIUM)  500 MG chewable tablet Chew 2 tablets by mouth 2 (two) times daily as needed for indigestion or heartburn. Patient not taking: Reported on 07/20/2020    [provider]  coconut oil OIL Apply 1 application topically as needed. Patient not taking: Reported on 07/20/2020 07/03/20   Alric Seton, MD  ibuprofen (ADVIL) 600 MG tablet Take 1 tablet (600 mg total) by mouth every 6 (six) hours as needed. Patient not taking: Reported on 07/20/2020 07/03/20   Alric Seton, MD  Misc. Devices (GOJJI WEIGHT SCALE) MISC 1 Device by Does not apply route daily as needed. To weight self daily as needed at home. ICD-10 code: O42.90 Patient not taking: Reported on 07/20/2020 01/17/20   Raelyn Mora, CNM  oxyCODONE (OXY IR/ROXICODONE) 5 MG immediate release tablet Take 1-2 tablets (5-10 mg total) by mouth every 4 (four) hours as needed for moderate pain. Patient not taking: Reported on 07/20/2020 07/03/20   Alric Seton, MD   prenatal vitamin w/FE, FA (PRENATAL 1 + 1) 27-1 MG TABS tablet Take 1 tablet by mouth daily at 12 noon. 01/17/20   Raelyn Mora, CNM    Allergies    Patient has no known allergies.  Review of Systems   Review of Systems  Gastrointestinal:  Positive for abdominal pain. Negative for diarrhea.  All other systems reviewed and are negative.  Physical Exam Updated Vital Signs BP 105/66   Pulse (!) 56   Temp 98.1 F (36.7 C) (Oral)   Resp 16   Ht 5\' 3"  (1.6 m)   Wt 74.8 kg   SpO2 99%   BMI 29.23 kg/m   Physical Exam Vitals and nursing note reviewed.  Constitutional:      Appearance: She is well-developed.  HENT:     Head: Normocephalic.  Cardiovascular:     Rate and Rhythm: Normal rate and regular rhythm.  Pulmonary:     Effort: Pulmonary effort is normal.  Abdominal:     General: Bowel sounds are normal. There is no distension.     Palpations: Abdomen is soft.     Tenderness: There is abdominal tenderness.     Hernia: A hernia is present. Hernia is present in the umbilical area.  Musculoskeletal:        General: Normal range of motion.     Cervical back: Normal range of motion.  Skin:    General: Skin is warm.  Neurological:     Mental Status: She is alert and oriented to person, place, and time.    ED Results / Procedures / Treatments   Labs (all labs ordered are listed, but only abnormal results are displayed) Labs Reviewed  CBC WITH DIFFERENTIAL/PLATELET - Abnormal; Notable for the following components:      Result Value   WBC 11.6 (*)    Neutro Abs 9.4 (*)    All other components within normal limits  BASIC METABOLIC PANEL - Abnormal; Notable for the following components:   Potassium 3.2 (*)    CO2 19 (*)    Glucose, Bld 110 (*)    All other components within normal limits  RESP PANEL BY RT-PCR (FLU A&B, COVID) ARPGX2  I-STAT BETA HCG BLOOD, ED ([facility], [facility], [facility] ONLY)    EKG None  Radiology No results found.  Procedures Procedures   Medications  Ordered in ED Medications  HYDROmorphone (DILAUDID) injection 1 mg (1 mg Intravenous Given 11/19/20 1528)  ondansetron (ZOFRAN) injection 4 mg (4 mg Intravenous Given 11/19/20 1525)  iohexol (  OMNIPAQUE) 300 MG/ML solution 100 mL (100 mLs Intravenous Contrast Given 11/19/20 1855)    ED Course  I have reviewed the triage vital signs and the nursing notes.  Pertinent labs & imaging results that were available during my care of the patient were reviewed by me and considered in my medical decision making (see chart for details).    MDM Rules/Calculators/A&P                           MDM:  Pt given dilaudid and zofran.  I attempted reduction without success.  I spoke to [place] with [facility] surgery at 3:45.  She advised Ct scan and  call after results  Dr. Derrell Lolling here to see and examine.   Final Clinical Impression(s) / ED Diagnoses Final diagnoses:  Hernia of abdominal wall    Rx / DC Orders ED Discharge Orders     None        Osie Cheeks 11/19/20 Lawanna Kobus, MD 11/19/20 2322

## 2020-11-19 NOTE — Progress Notes (Addendum)
Received patient from ED, pt. ambulatory, AOx4, VSS with soft BP and brady PR, abdominal pain at 7/10 after ambulating in room, oriented to room, bed controls, call light and plan of care.  Administered PRN pain medication Toradol per order.  Patient nw comfortably lying on bed watching TV. Pre-op procedure done, will monitor.

## 2020-11-19 NOTE — ED Triage Notes (Addendum)
BIB EMS for hernia in right side of abdomen. Pt picked up one her kids and felt a "pop" in her abdomen, same area where she had a history of hernia. Feel a hard lump in that area. Received 150cmg of fentanyl, pt still in severe pain.Pt is 16 weeks postpartum, C-section incision intact.

## 2020-11-19 NOTE — ED Notes (Signed)
This RN attempted to call report to receiving RN. RN states he is performing pt care and will call back for report shortly.

## 2020-11-20 ENCOUNTER — Observation Stay (HOSPITAL_COMMUNITY): Payer: BC Managed Care – PPO | Admitting: Anesthesiology

## 2020-11-20 ENCOUNTER — Encounter (HOSPITAL_COMMUNITY)
Admission: EM | Disposition: A | Discharge status: Home / Self Care | Payer: Self-pay | Source: Home / Self Care | Attending: Emergency Medicine

## 2020-11-20 ENCOUNTER — Encounter (HOSPITAL_COMMUNITY): Payer: Self-pay

## 2020-11-20 DIAGNOSIS — K439 Ventral hernia without obstruction or gangrene: Secondary | ICD-10-CM | POA: Diagnosis not present

## 2020-11-20 DIAGNOSIS — B182 Chronic viral hepatitis C: Secondary | ICD-10-CM | POA: Diagnosis not present

## 2020-11-20 DIAGNOSIS — K436 Other and unspecified ventral hernia with obstruction, without gangrene: Secondary | ICD-10-CM | POA: Diagnosis not present

## 2020-11-20 DIAGNOSIS — K420 Umbilical hernia with obstruction, without gangrene: Secondary | ICD-10-CM | POA: Diagnosis not present

## 2020-11-20 DIAGNOSIS — N390 Urinary tract infection, site not specified: Secondary | ICD-10-CM | POA: Diagnosis not present

## 2020-11-20 HISTORY — PX: LAPAROSCOPY: SHX197

## 2020-11-20 HISTORY — PX: VENTRAL HERNIA REPAIR: SHX424

## 2020-11-20 LAB — BASIC METABOLIC PANEL
Anion gap: 7 (ref 5–15)
BUN: 9 mg/dL (ref 6–20)
CO2: 24 mmol/L (ref 22–32)
Calcium: 8.7 mg/dL — ABNORMAL LOW (ref 8.9–10.3)
Chloride: 103 mmol/L (ref 98–111)
Creatinine, Ser: 0.89 mg/dL (ref 0.44–1.00)
GFR, Estimated: >60 mL/min (ref >60)
Glucose, Bld: 111 mg/dL — ABNORMAL HIGH (ref 70–99)
Potassium: 3.8 mmol/L (ref 3.5–5.1)
Sodium: 134 mmol/L — ABNORMAL LOW (ref 135–145)

## 2020-11-20 LAB — SURGICAL PCR SCREEN
MRSA, PCR: NEGATIVE
Staphylococcus aureus: NEGATIVE

## 2020-11-20 LAB — CBC
HCT: 34.4 % — ABNORMAL LOW (ref 36.0–46.0)
Hemoglobin: 11.9 g/dL — ABNORMAL LOW (ref 12.0–15.0)
MCH: 32.4 pg (ref 26.0–34.0)
MCHC: 34.6 g/dL (ref 30.0–36.0)
MCV: 93.7 fL (ref 80.0–100.0)
Platelets: 248 10*3/uL (ref 150–400)
RBC: 3.67 MIL/uL — ABNORMAL LOW (ref 3.87–5.11)
RDW: 12.4 % (ref 11.5–15.5)
WBC: 6.2 10*3/uL (ref 4.0–10.5)
nRBC: 0 % (ref 0.0–0.2)

## 2020-11-20 SURGERY — LAPAROSCOPY, DIAGNOSTIC
Anesthesia: General | Site: Abdomen

## 2020-11-20 MED ORDER — HYDROMORPHONE HCL 1 MG/ML IJ SOLN
0.2500 mg | INTRAMUSCULAR | Status: DC | PRN
  Administered 2020-11-20 (×3): 0.5 mg via INTRAVENOUS

## 2020-11-20 MED ORDER — ACETAMINOPHEN 10 MG/ML IV SOLN
1000.0000 mg | Freq: Once | INTRAVENOUS | Status: DC | PRN
  Administered 2020-11-20: 1000 mg via INTRAVENOUS

## 2020-11-20 MED ORDER — METHOCARBAMOL 500 MG PO TABS
500.0000 mg | ORAL_TABLET | Freq: Three times a day (TID) | ORAL | Status: DC
  Administered 2020-11-20 – 2020-11-21 (×3): 500 mg via ORAL
  Filled 2020-11-20 (×3): qty 1

## 2020-11-20 MED ORDER — FENTANYL CITRATE (PF) 250 MCG/5ML IJ SOLN
INTRAMUSCULAR | Status: AC
  Filled 2020-11-20: qty 5

## 2020-11-20 MED ORDER — LACTATED RINGERS IV SOLN: INTRAVENOUS | Status: DC

## 2020-11-20 MED ORDER — SUGAMMADEX SODIUM 200 MG/2ML IV SOLN
INTRAVENOUS | Status: DC | PRN
  Administered 2020-11-20: 200 mg via INTRAVENOUS

## 2020-11-20 MED ORDER — MIDAZOLAM HCL 2 MG/2ML IJ SOLN
INTRAMUSCULAR | Status: AC
  Filled 2020-11-20: qty 2

## 2020-11-20 MED ORDER — HYDROMORPHONE HCL 1 MG/ML IJ SOLN
INTRAMUSCULAR | Status: AC
  Filled 2020-11-20: qty 2

## 2020-11-20 MED ORDER — PROMETHAZINE HCL 25 MG/ML IJ SOLN
INTRAMUSCULAR | Status: AC
  Filled 2020-11-20: qty 1

## 2020-11-20 MED ORDER — MIDAZOLAM HCL 5 MG/5ML IJ SOLN
INTRAMUSCULAR | Status: DC | PRN
  Administered 2020-11-20: 2 mg via INTRAVENOUS

## 2020-11-20 MED ORDER — ROCURONIUM BROMIDE 10 MG/ML (PF) SYRINGE
PREFILLED_SYRINGE | INTRAVENOUS | Status: DC | PRN
  Administered 2020-11-20: 50 mg via INTRAVENOUS

## 2020-11-20 MED ORDER — BUPIVACAINE-EPINEPHRINE 0.25% -1:200000 IJ SOLN
INTRAMUSCULAR | Status: DC | PRN
  Administered 2020-11-20: 14 mL

## 2020-11-20 MED ORDER — OXYCODONE HCL 5 MG PO TABS
5.0000 mg | ORAL_TABLET | Freq: Four times a day (QID) | ORAL | Status: DC | PRN
  Administered 2020-11-20 (×2): 10 mg via ORAL
  Filled 2020-11-20 (×2): qty 2

## 2020-11-20 MED ORDER — ACETAMINOPHEN 500 MG PO TABS
1000.0000 mg | ORAL_TABLET | Freq: Four times a day (QID) | ORAL | Status: DC
  Administered 2020-11-20 – 2020-11-21 (×4): 1000 mg via ORAL
  Filled 2020-11-20 (×4): qty 2

## 2020-11-20 MED ORDER — PROMETHAZINE HCL 25 MG/ML IJ SOLN
6.2500 mg | INTRAMUSCULAR | Status: DC | PRN
  Administered 2020-11-20: 12.5 mg via INTRAVENOUS

## 2020-11-20 MED ORDER — ORAL CARE MOUTH RINSE: 15.0000 mL | Freq: Once | OROMUCOSAL | Status: AC

## 2020-11-20 MED ORDER — OXYCODONE HCL 5 MG PO TABS
5.0000 mg | ORAL_TABLET | Freq: Four times a day (QID) | ORAL | Status: DC | PRN
  Administered 2020-11-20: 5 mg via ORAL
  Filled 2020-11-20: qty 1

## 2020-11-20 MED ORDER — KETOROLAC TROMETHAMINE 30 MG/ML IJ SOLN
30.0000 mg | Freq: Three times a day (TID) | INTRAMUSCULAR | Status: DC
  Administered 2020-11-20 – 2020-11-21 (×3): 30 mg via INTRAVENOUS
  Filled 2020-11-20 (×3): qty 1

## 2020-11-20 MED ORDER — 0.9 % SODIUM CHLORIDE (POUR BTL) OPTIME
TOPICAL | Status: DC | PRN
  Administered 2020-11-20: 1000 mL

## 2020-11-20 MED ORDER — ACETAMINOPHEN 500 MG PO TABS
1000.0000 mg | ORAL_TABLET | ORAL | Status: AC
  Administered 2020-11-20: 1000 mg via ORAL
  Filled 2020-11-20: qty 2

## 2020-11-20 MED ORDER — CHLORHEXIDINE GLUCONATE 0.12 % MT SOLN: 15.0000 mL | Freq: Once | OROMUCOSAL | Status: AC

## 2020-11-20 MED ORDER — CHLORHEXIDINE GLUCONATE 0.12 % MT SOLN
OROMUCOSAL | Status: AC
  Administered 2020-11-20: 15 mL
  Filled 2020-11-20: qty 15

## 2020-11-20 MED ORDER — DEXAMETHASONE SODIUM PHOSPHATE 10 MG/ML IJ SOLN
INTRAMUSCULAR | Status: DC | PRN
  Administered 2020-11-20: 10 mg via INTRAVENOUS

## 2020-11-20 MED ORDER — ACETAMINOPHEN 10 MG/ML IV SOLN
INTRAVENOUS | Status: AC
  Filled 2020-11-20: qty 100

## 2020-11-20 MED ORDER — KETOROLAC TROMETHAMINE 15 MG/ML IJ SOLN: 15.0000 mg | INTRAMUSCULAR | Status: DC

## 2020-11-20 MED ORDER — FENTANYL CITRATE (PF) 250 MCG/5ML IJ SOLN
INTRAMUSCULAR | Status: DC | PRN
  Administered 2020-11-20 (×5): 50 ug via INTRAVENOUS

## 2020-11-20 MED ORDER — ONDANSETRON HCL 4 MG/2ML IJ SOLN
INTRAMUSCULAR | Status: DC | PRN
  Administered 2020-11-20: 4 mg via INTRAVENOUS

## 2020-11-20 MED ORDER — KETOROLAC TROMETHAMINE 15 MG/ML IJ SOLN: 15.0000 mg | Freq: Three times a day (TID) | INTRAMUSCULAR | Status: DC | PRN

## 2020-11-20 MED ORDER — PROPOFOL 10 MG/ML IV BOLUS
INTRAVENOUS | Status: DC | PRN
  Administered 2020-11-20: 150 mg via INTRAVENOUS

## 2020-11-20 MED ORDER — LIDOCAINE 2% (20 MG/ML) 5 ML SYRINGE
INTRAMUSCULAR | Status: DC | PRN
  Administered 2020-11-20: 100 mg via INTRAVENOUS

## 2020-11-20 MED ORDER — PROPOFOL 10 MG/ML IV BOLUS
INTRAVENOUS | Status: AC
  Filled 2020-11-20: qty 20

## 2020-11-20 MED ORDER — SUCCINYLCHOLINE CHLORIDE 200 MG/10ML IV SOSY
PREFILLED_SYRINGE | INTRAVENOUS | Status: DC | PRN
  Administered 2020-11-20: 120 mg via INTRAVENOUS

## 2020-11-20 MED ORDER — BUPIVACAINE-EPINEPHRINE (PF) 0.25% -1:200000 IJ SOLN
INTRAMUSCULAR | Status: AC
  Filled 2020-11-20: qty 30

## 2020-11-20 SURGICAL SUPPLY — 43 items
APPLIER CLIP 5 13 M/L LIGAMAX5 (MISCELLANEOUS)
BAG COUNTER SPONGE SURGICOUNT (BAG) ×2 IMPLANT
BLADE CLIPPER SURG (BLADE) IMPLANT
CANISTER SUCT 3000ML PPV (MISCELLANEOUS) ×2 IMPLANT
CHLORAPREP W/TINT 26 (MISCELLANEOUS) ×2 IMPLANT
CLIP APPLIE 5 13 M/L LIGAMAX5 (MISCELLANEOUS) IMPLANT
CLSR STERI-STRIP ANTIMIC 1/2X4 (GAUZE/BANDAGES/DRESSINGS) ×2 IMPLANT
COVER SURGICAL LIGHT HANDLE (MISCELLANEOUS) ×2 IMPLANT
DECANTER SPIKE VIAL GLASS SM (MISCELLANEOUS) ×2 IMPLANT
DERMABOND ADVANCED (GAUZE/BANDAGES/DRESSINGS) ×1
DERMABOND ADVANCED .7 DNX12 (GAUZE/BANDAGES/DRESSINGS) ×1 IMPLANT
DEVICE TROCAR PUNCTURE CLOSURE (ENDOMECHANICALS) IMPLANT
DRAPE LAPAROSCOPIC ABDOMINAL (DRAPES) ×2 IMPLANT
ELECT CAUTERY BLADE 6.4 (BLADE) ×2 IMPLANT
ELECT REM PT RETURN 9FT ADLT (ELECTROSURGICAL) ×2
ELECTRODE REM PT RTRN 9FT ADLT (ELECTROSURGICAL) ×1 IMPLANT
GAUZE SPONGE 4X4 12PLY STRL (GAUZE/BANDAGES/DRESSINGS) IMPLANT
GLOVE SURG ENC MOIS LTX SZ7 (GLOVE) ×2 IMPLANT
GLOVE SURG UNDER POLY LF SZ7.5 (GLOVE) ×2 IMPLANT
GOWN STRL REUS W/ TWL LRG LVL3 (GOWN DISPOSABLE) ×3 IMPLANT
GOWN STRL REUS W/TWL LRG LVL3 (GOWN DISPOSABLE) ×3
KIT BASIN OR (CUSTOM PROCEDURE TRAY) ×2 IMPLANT
KIT TURNOVER KIT B (KITS) ×2 IMPLANT
NS IRRIG 1000ML POUR BTL (IV SOLUTION) ×2 IMPLANT
PACK GENERAL/GYN (CUSTOM PROCEDURE TRAY) ×2 IMPLANT
PAD ARMBOARD 7.5X6 YLW CONV (MISCELLANEOUS) ×4 IMPLANT
PENCIL SMOKE EVACUATOR (MISCELLANEOUS) ×2 IMPLANT
SCISSORS LAP 5X35 DISP (ENDOMECHANICALS) IMPLANT
SET IRRIG TUBING LAPAROSCOPIC (IRRIGATION / IRRIGATOR) IMPLANT
SET TUBE SMOKE EVAC HIGH FLOW (TUBING) ×2 IMPLANT
SLEEVE ENDOPATH XCEL 5M (ENDOMECHANICALS) ×4 IMPLANT
STAPLER VISISTAT 35W (STAPLE) IMPLANT
SUT NOV 1 T60/GS (SUTURE) IMPLANT
SUT NOVA NAB GS-21 0 18 T12 DT (SUTURE) ×4 IMPLANT
SUT VIC AB 3-0 SH 27 (SUTURE) ×2
SUT VIC AB 3-0 SH 27XBRD (SUTURE) ×2 IMPLANT
TOWEL GREEN STERILE (TOWEL DISPOSABLE) ×2 IMPLANT
TOWEL GREEN STERILE FF (TOWEL DISPOSABLE) ×2 IMPLANT
TRAY FOLEY MTR SLVR 14FR STAT (SET/KITS/TRAYS/PACK) IMPLANT
TRAY LAPAROSCOPIC MC (CUSTOM PROCEDURE TRAY) ×2 IMPLANT
TROCAR XCEL BLUNT TIP 100MML (ENDOMECHANICALS) IMPLANT
TROCAR XCEL NON-BLD 11X100MML (ENDOMECHANICALS) IMPLANT
TROCAR XCEL NON-BLD 5MMX100MML (ENDOMECHANICALS) ×2 IMPLANT

## 2020-11-20 NOTE — Anesthesia Postprocedure Evaluation (Signed)
Anesthesia Post Note  Patient: Allexus Ovens  Procedure(s) Performed: LAPAROSCOPY DIAGNOSTIC (Abdomen) HERNIA REPAIR VENTRAL ADULT POSSIBLE MESH (Abdomen)     Patient location during evaluation: PACU Anesthesia Type: General Level of consciousness: awake and alert Pain management: pain level controlled Vital Signs Assessment: post-procedure vital signs reviewed and stable Respiratory status: spontaneous breathing, nonlabored ventilation, respiratory function stable and patient connected to nasal cannula oxygen Cardiovascular status: blood pressure returned to baseline and stable Postop Assessment: no apparent nausea or vomiting Anesthetic complications: no   No notable events documented.  Last Vitals:  Vitals:   11/20/20 1120 11/20/20 1138  BP: (!) 87/60 103/65  Pulse: (!) 53 (!) 55  Resp: 16   Temp: 36.7 C   SpO2: 94% 97%    Last Pain:  Vitals:   11/20/20 1120  TempSrc: Oral  PainSc: 10-Worst pain ever                 Fynley Chrystal S

## 2020-11-20 NOTE — Discharge Summary (Addendum)
Physician Discharge Summary  Patient ID: Beth Cordova MRN: [id number] DOB/AGE: June 02, 1985 35 y.o.  Admit date: 11/19/2020 Discharge date: 11/21/2020  Admission Diagnoses:   Incarcerated ventral hernia  History of gestational diabetes mellitus (GDM)     Prior pregnancy with fetal demise and current pregnancy   Grand multipara   Hepatitis C antibody positive in blood   Chronic hepatitis C virus infection (HCC)   History of bilateral tubal ligation  Discharge Diagnoses:  Same  Active Problems:   Incarcerated hernia   PROCEDURES:   Diagnostic laparoscopy, Primary repair of umbilical and epigastric hernias 11/20/20, Dr. Charm Rings Course:  Incarcerated ventral hernia HPI: Patient is a 35 year old female, with a 5+ year history of a ventral hernia that she has known about.  Patient states that with her most recent pregnancies she noted that the hernia protruded.  She states that she has had no symptoms of incarceration in the past.  This was her first episode.  She states it began day of admission at 1 PM.  She states that this was significantly painful with associated nausea vomiting.  Patient came to the ER for further evaluation. Upon evaluation the ER she underwent CT scan.  CT scan was significant for incarcerated loop of small bowel within the hernia.  Patient had minimal leukocytosis.   General surgery was consulted for further evaluation and management.  Dr. Derrell Lolling reduced the hernia in the ED.  She was admitted for repair at her request.  Patient was admitted and underwent procedure listed above.  Tolerated procedure well and was transferred to the floor.  Diet was advanced as tolerated.  On POD1, the patient was voiding well, tolerating diet, ambulating well, pain well controlled, vital signs stable, incisions c/d/i and felt stable for discharge home.  Patient will follow up in our office in 3 weeks and knows to call with questions or concerns.      Physical  Exam: General:  Alert, NAD, pleasant, comfortable Abd:  Soft, ND, mild tenderness, incisions C/D/I  I or a member of my team have reviewed this patient in the Controlled Substance Database.     Discharge disposition: 01-Home or Self Care      Allergies as of 11/21/2020   No Known Allergies      Medication List     TAKE these medications    acetaminophen 500 MG tablet Commonly known as: TYLENOL Take 1,000 mg by mouth every 6 (six) hours as needed for moderate pain or headache. What changed: Another medication with the same name was removed. Continue taking this medication, and follow the directions you see here.   docusate sodium 100 MG capsule Commonly known as: Colace Take 1 capsule (100 mg total) by mouth 2 (two) times daily for 3 days.   HAIR SKIN AND NAILS FORMULA PO Take 1 tablet by mouth daily.   ibuprofen 800 MG tablet Commonly known as: ADVIL Take 1 tablet (800 mg total) by mouth every 8 (eight) hours as needed for mild pain or moderate pain. What changed:  medication strength how much to take when to take this reasons to take this   methocarbamol 500 MG tablet Commonly known as: ROBAXIN Take 1 tablet (500 mg total) by mouth every 6 (six) hours as needed for up to 5 days for muscle spasms (abdominal pain).   OVER THE COUNTER MEDICATION Take 1 tablet by mouth every Monday, Wednesday, and Friday. gummy   oxyCODONE 5 MG immediate release tablet Commonly known as:  Oxy IR/ROXICODONE Take 1 tablet (5 mg total) by mouth every 6 (six) hours as needed for up to 5 days for moderate pain or severe pain. What changed:  how much to take when to take this reasons to take this   polyethylene glycol 17 g packet Commonly known as: MIRALAX / GLYCOLAX Take 17 g by mouth daily as needed for up to 5 days for moderate constipation or severe constipation.   prenatal multivitamin Tabs tablet Take 1 tablet by mouth daily.   prenatal vitamin w/FE, FA 27-1 MG Tabs  tablet Take 1 tablet by mouth daily at 12 noon.       ASK your doctor about these medications    Blood Pressure Monitor Automat Devi 1 Device by Does not apply route daily. Automatic blood pressure cuff regular size. To monitor blood pressure regularly at home. ICD-10 code: O09.90   calcium carbonate 500 MG chewable tablet Commonly known as: TUMS - dosed in mg elemental calcium Chew 2 tablets by mouth 2 (two) times daily as needed for indigestion or heartburn.   coconut oil Oil Apply 1 application topically as needed.   Gojji Weight Scale Misc 1 Device by Does not apply route daily as needed. To weight self daily as needed at home. ICD-10 code: O70.90        Follow-up Information     Emelia Loron, MD Follow up on 12/14/2020.   Specialty: General Surgery Why: Your appointment is at 10:20AM.  Be at the office 30 minutes early for check in.  Bring photo ID and insurance information. Contact information: [address] [place] [address] [phone]                 Signed: Eric Form 11/21/2020, 9:17 AM

## 2020-11-20 NOTE — Anesthesia Procedure Notes (Signed)
Procedure Name: Intubation Date/Time: 11/20/2020 9:15 AM Performed by: Griffin Dakin, CRNA Pre-anesthesia Checklist: Patient identified, Emergency Drugs available, Suction available and Patient being monitored Patient Re-evaluated:Patient Re-evaluated prior to induction Oxygen Delivery Method: Circle system utilized Preoxygenation: Pre-oxygenation with 100% oxygen Induction Type: IV induction and Rapid sequence Laryngoscope Size: Mac and 3 Grade View: Grade I Tube type: Oral Tube size: 7.0 mm Number of attempts: 1 Airway Equipment and Method: Stylet Placement Confirmation: ETT inserted through vocal cords under direct vision, positive ETCO2 and breath sounds checked- equal and bilateral Secured at: 21 cm Tube secured with: Tape Dental Injury: Teeth and Oropharynx as per pre-operative assessment

## 2020-11-20 NOTE — Op Note (Signed)
Preoperative diagnosis: Ventral hernia status post incarceration Postoperative diagnosis: Ventral hernia x2 Procedure: 1.  Diagnostic laparoscopy 2.  Primary repair of umbilical and epigastric hernias Surgeon: Dr. Harden Mo Anesthesia: General Specimens: None Estimated blood loss: Minimal Complications: None Drains: None Sponge and count was correct at completion Disposition to recovery stable condition  Indications: This a 35 year old female who is recently postpartum who comes in with an incarcerated hernia.  This was reduced by one of my partners overnight.  She was still having some mild abdominal pain today.  We discussed going to the operating room for diagnostic laparoscopy as well as repair of the hernias either primarily or with mesh.  Procedure: After informed consent was obtained the patient was taken the operating.  She was given antibiotics.  SCDs were in place.  She was placed under general anesthesia without complication.  She was prepped and draped in the standard sterile surgical fashion.  Surgical timeout was then performed.  Filtrated Marcaine in the left upper quadrant.  Her stomach was evacuated.  I then using a direct optical entry entered into the abdomen.  This was done without injury.  I then insufflated the abdomen to 15 mmHg pressure.  Eventually I inserted 2 additional 5 mm trochars in the left side of the abdomen.  She had evidence of her recent cesarean section.  She had a very tiny umbilical hernia.  I ended up taking down the falciform ligament with cautery and was able to identify what appeared to be the hernia by the CAT scan where she had actually incarcerated.  The falciform was in this hernia.  Once I done that I elected to fix both of these primarily as they were very small.  I made an infraumbilical incision and lifted up the umbilical stalk.  I then placed several 0 Novafil sutures to completely obliterate this defect.  I then made a small incision over  the epigastric hernia.  I used the suture passer and used 0 Novafil to completely obliterate that defect as well.  These were closed without any tension due to their small size.  There was no evidence of any compromise of the bowel from the recent incarceration as well.  I then closed the epigastric incision with 3-0 Vicryl and 4-0 Monocryl.  The umbilical incision was closed with 3-0 Vicryl tacking the umbilicus down and then 4-0 Monocryl.  I then remove the trochars and desufflated the abdomen.  These were all closed with 4 Monocryl glue and Steri-Strips.  She tolerated this well was extubated and transferred to recovery stable.

## 2020-11-20 NOTE — Progress Notes (Signed)
Day of Surgery   Subjective/Chief Complaint: Abdomen still sore   Objective: Vital signs in last 24 hours: Temp:  [98 F (36.7 C)-99.2 F (37.3 C)] 98 F (36.7 C) (08/09 0558) Pulse Rate:  [47-60] 47 (08/09 0558) Resp:  [16-22] 17 (08/09 0558) BP: (91-121)/(59-102) 103/69 (08/09 0558) SpO2:  [97 %-100 %] 99 % (08/09 0558) Weight:  [74.8 kg] 74.8 kg (08/08 1510) Last BM Date: 11/19/20  Intake/Output from previous day: 08/08 0701 - 08/09 0700 In: 1324.9 [P.O.:240; I.V.:1084.9] Out: -  Intake/Output this shift: No intake/output data recorded.  GI: soft mildly tender no hernia palpable  Lab Results:  Recent Labs    11/19/20 1629 11/20/20 0126  WBC 11.6* 6.2  HGB 13.1 11.9*  HCT 38.6 34.4*  PLT 259 248   BMET Recent Labs    11/19/20 1629 11/20/20 0126  NA 139 134*  K 3.2* 3.8  CL 108 103  CO2 19* 24  GLUCOSE 110* 111*  BUN 11 9  CREATININE 0.85 0.89  CALCIUM 9.0 8.7*   PT/INR No results for input(s): LABPROT, INR in the last 72 hours. ABG No results for input(s): PHART, HCO3 in the last 72 hours.  Invalid input(s): PCO2, PO2  Studies/Results: CT ABDOMEN PELVIS W CONTRAST  Result Date: 11/19/2020 CLINICAL DATA:  Concern for right-sided hernia, felt pop after picking up child EXAM: CT ABDOMEN AND PELVIS WITH CONTRAST TECHNIQUE: Multidetector CT imaging of the abdomen and pelvis was performed using the standard protocol following bolus administration of intravenous contrast. CONTRAST:  OMNIPAQUE IOHEXOL 300 MG/ML  SOLN COMPARISON:  None. FINDINGS: Lower chest: No acute abnormality. Hepatobiliary: Hypodense 2.4 cm lesion in the posterior aspect of the right lobe of the liver on image 18/3 which appears to demonstrate some peripheral nodular discontinuous enhancement best seen on coronal image 66. Similar appearing lesion in the inferior aspect of the right lobe of the liver measuring 10 mm on image 29/3 and additional similar appearing lesion in the inferior  aspect of the left lobe of the liver measuring 8 mm on image 29/3. Gallbladder is unremarkable. No biliary ductal dilation. Pancreas: Within normal limits. Spleen: Within normal limits. Adrenals/Urinary Tract: Adrenal glands are unremarkable. Kidneys are normal, without renal calculi, solid enhancing lesion, or hydronephrosis. Bladder is unremarkable for degree of distension. Stomach/Bowel: Stomach is unremarkable. No pathologic dilation of small bowel. Ventral hernia containing fat and prominent fluid-filled small bowel measuring up to 2.2 cm with an aperture width of 15 mm. Normal appendix. No evidence of acute large bowel inflammation. Vascular/Lymphatic: No significant vascular findings are present. No pathologically enlarged abdominal or pelvic lymph nodes. Reproductive: Uterus and bilateral adnexa are unremarkable. Tampon in the vagina. Other: Anterior abdominal wall hernia, described below above. No abdominopelvic ascites. Musculoskeletal: No acute osseous abnormality. IMPRESSION: 1. Ventral hernia containing fat and a prominent fluid-filled loop of small bowel measuring up to 2.2 cm with a aperture width of 15 mm. No pathologic dilation of small bowel, however early obstruction not excluded on this examination. Recommend clinical correlation for reducibility and surgical consult. 2. 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 Consider more definitive characterization with nonemergent hepatic protocol MRI with and without contrast. Electronically Signed   By: Maudry Mayhew MD   On: 11/19/2020 19:24    Anti-infectives: Anti-infectives (From admission, onward)    Start     Dose/Rate Route Frequency Ordered Stop   11/20/20 0600  [MAR Hold]  ceFAZolin (ANCEF) IVPB 2g/100 mL premix        (MAR Hold since Tue 11/20/2020 at 0813.Hold Reason: Transfer to a Procedural area)   2 g 200 mL/hr over 30 Minutes  Intravenous On call to O.R. 11/19/20 1958 11/21/20 0559       Assessment/Plan: Primary umbilical hernia -discussed going to OR this am. Will do dx lsc first to make sure bowel all viable although I think this is case. She is still mildly tender this am. If the hernia defect is really less than 2 cm will consider anterior repair either primarily or with mesh.  Discussed surgery, risks and recovery associated with repair and will proceed this am.    Beth Cordova 11/20/2020

## 2020-11-20 NOTE — Transfer of Care (Signed)
Immediate Anesthesia Transfer of Care Note  Patient: Beth Cordova  Procedure(s) Performed: LAPAROSCOPY DIAGNOSTIC (Abdomen) HERNIA REPAIR VENTRAL ADULT POSSIBLE MESH (Abdomen)  Patient Location: PACU  Anesthesia Type:General  Level of Consciousness: awake, alert  and oriented  Airway & Oxygen Therapy: Patient Spontanous Breathing  Post-op Assessment: Report given to RN and Post -op Vital signs reviewed and stable  Post vital signs: Reviewed and stable  Last Vitals:  Vitals Value Taken Time  BP 123/84 11/20/20 1033  Temp    Pulse 87 11/20/20 1037  Resp 17 11/20/20 1037  SpO2 98 % 11/20/20 1037  Vitals shown include unvalidated device data.  Last Pain:  Vitals:   11/20/20 0830  TempSrc:   PainSc: 0-No pain         Complications: No notable events documented.

## 2020-11-20 NOTE — Progress Notes (Signed)
Pt tolerating reg diet, passed gas, ambulating in room and hallway.

## 2020-11-20 NOTE — Progress Notes (Signed)
Off unit to surgery with Dr. Dwain Sarna

## 2020-11-20 NOTE — Anesthesia Preprocedure Evaluation (Signed)
Anesthesia Evaluation  Patient identified by MRN, date of birth, ID band Patient awake    Reviewed: Allergy & Precautions, NPO status , Patient's Chart, lab work & pertinent test results  Airway Mallampati: II  TM Distance: >3 FB Neck ROM: Full    Dental no notable dental hx.    Pulmonary neg pulmonary ROS, former smoker,    Pulmonary exam normal breath sounds clear to auscultation       Cardiovascular negative cardio ROS Normal cardiovascular exam Rhythm:Regular Rate:Normal     Neuro/Psych negative neurological ROS  negative psych ROS   GI/Hepatic negative GI ROS, (+) Hepatitis -, C  Endo/Other  negative endocrine ROS  Renal/GU negative Renal ROS  negative genitourinary   Musculoskeletal negative musculoskeletal ROS (+)   Abdominal   Peds negative pediatric ROS (+)  Hematology negative hematology ROS (+)   Anesthesia Other Findings   Reproductive/Obstetrics negative OB ROS                             Anesthesia Physical Anesthesia Plan  ASA: 2  Anesthesia Plan: General   Post-op Pain Management:    Induction: Intravenous and Rapid sequence  PONV Risk Score and Plan: 3 and Ondansetron, Dexamethasone and Treatment may vary due to age or medical condition  Airway Management Planned: Oral ETT  Additional Equipment:   Intra-op Plan:   Post-operative Plan: Extubation in OR  Informed Consent: I have reviewed the patients History and Physical, chart, labs and discussed the procedure including the risks, benefits and alternatives for the proposed anesthesia with the patient or authorized representative who has indicated his/her understanding and acceptance.     Dental advisory given  Plan Discussed with: CRNA and Surgeon  Anesthesia Plan Comments:         Anesthesia Quick Evaluation

## 2020-11-20 NOTE — Discharge Instructions (Signed)
[facility]  UMBILICAL OR INGUINAL HERNIA REPAIR: POST OP INSTRUCTIONS  Always review your discharge instruction sheet given to you by the facility where your surgery was performed. IF YOU HAVE DISABILITY OR FAMILY LEAVE FORMS, YOU MUST BRING THEM TO THE OFFICE FOR PROCESSING.   DO NOT GIVE THEM TO YOUR DOCTOR.  A  prescription for pain medication may be given to you upon discharge.  Take your pain medication as prescribed, if needed.  If narcotic pain medicine is not needed, then you may take acetaminophen (Tylenol), naprosyn (Alleve) or ibuprofen (Advil) as needed. Take your usually prescribed medications unless otherwise directed. If you need a refill on your pain medication, please contact your pharmacy.  They will contact our office to request authorization. Prescriptions will not be filled after 5 pm or on week-ends. You should follow a light diet the first 24 hours after arrival home, such as soup and crackers, etc.  Be sure to include lots of fluids daily.  Resume your normal diet the day after surgery. Most patients will experience some swelling and bruising around the umbilicus or in the groin and scrotum.  Ice packs and reclining will help.  Swelling and bruising can take several days to resolve.  It is common to experience some constipation if taking pain medication after surgery.  Increasing fluid intake and taking a stool softener (such as Colace) will usually help or prevent this problem from occurring.  A mild laxative (Milk of Magnesia or Miralax) should be taken according to package directions if there are no bowel movements after 48 hours. Unless discharge instructions indicate otherwise, you may remove your bandages 48 hours after surgery, and you may shower at that time.  You may have steri-strips (small skin tapes) in place directly over the incision.  These strips should be left on the skin for 7-10 days and will come off on their own.  If your surgeon used  skin glue on the incision, you may shower in 24 hours.  The glue will flake off over the next 2-3 weeks.  Any sutures or staples will be removed at the office during your follow-up visit. ACTIVITIES:  You may resume regular (light) daily activities beginning the next day--such as daily self-care, walking, climbing stairs--gradually increasing activities as tolerated.  You may have sexual intercourse when it is comfortable.  Refrain from any heavy lifting or straining until approved by your doctor. You may drive when you are no longer taking prescription pain medication, you can comfortably wear a seatbelt, and you can safely maneuver your car and apply brakes. RETURN TO WORK:  __________________________________________________________ You should see your doctor in the office for a follow-up appointment approximately 2-3 weeks after your surgery.  Make sure that you call for this appointment within a day or two after you arrive home to insure a convenient appointment time. OTHER INSTRUCTIONS:  __________________________________________________________________________________________________________________________________________________________________________________________  WHEN TO CALL YOUR DOCTOR: Fever over 101.0 Inability to urinate Nausea and/or vomiting Extreme swelling or bruising Continued bleeding from incision. Increased pain, redness, or drainage from the incision  The clinic staff is available to answer your questions during regular business hours.  Please don't hesitate to call and ask to speak to one of the nurses for clinical concerns.  If you have a medical emergency, go to the nearest emergency room or call 911.  A surgeon from [facility] is always on call at the hospital   [address], [address], [place], [place]    [address] ?  [address] [address], [place], [place]   [address] [phone] ? [phone] ? FAX [phone] Web site:  [email or web address]  

## 2020-11-20 NOTE — Progress Notes (Addendum)
Topmost incision is pinker than on admission to [facility]. Pt is complaining that that incision is more painful than any of the other sites. Notified MD.  Dr. Doylene Canard responded. Applied ice to top incision and ordered abdominal binder.

## 2020-11-21 ENCOUNTER — Encounter (HOSPITAL_COMMUNITY): Payer: Self-pay | Admitting: General Surgery

## 2020-11-21 DIAGNOSIS — K436 Other and unspecified ventral hernia with obstruction, without gangrene: Secondary | ICD-10-CM | POA: Diagnosis not present

## 2020-11-21 MED ORDER — HYDROCORTISONE 0.5 % EX OINT
TOPICAL_OINTMENT | CUTANEOUS | Status: DC | PRN
  Filled 2020-11-21: qty 28.35

## 2020-11-21 MED ORDER — POLYETHYLENE GLYCOL 3350 17 G PO PACK: 17.0000 g | PACK | Freq: Every day | ORAL | 0 refills | Status: AC | PRN

## 2020-11-21 MED ORDER — METHOCARBAMOL 500 MG PO TABS: 500.0000 mg | ORAL_TABLET | Freq: Four times a day (QID) | ORAL | 0 refills | Status: AC | PRN

## 2020-11-21 MED ORDER — IBUPROFEN 800 MG PO TABS: 800.0000 mg | ORAL_TABLET | Freq: Three times a day (TID) | ORAL | 0 refills | Status: DC | PRN

## 2020-11-21 MED ORDER — DOCUSATE SODIUM 100 MG PO CAPS: 100.0000 mg | ORAL_CAPSULE | Freq: Two times a day (BID) | ORAL | 0 refills | Status: AC

## 2020-11-21 MED ORDER — HYDROCORTISONE 1 % EX CREA
TOPICAL_CREAM | CUTANEOUS | Status: DC | PRN
  Administered 2020-11-21: 1 via TOPICAL
  Filled 2020-11-21: qty 28

## 2020-11-21 MED ORDER — OXYCODONE HCL 5 MG PO TABS: 5.0000 mg | ORAL_TABLET | Freq: Four times a day (QID) | ORAL | 0 refills | Status: AC | PRN

## 2020-11-21 NOTE — Plan of Care (Signed)

## 2020-11-21 NOTE — Plan of Care (Signed)
  Problem: Education: Goal: Knowledge of General Education information will improve Description: Including pain rating scale, medication(s)/side effects and non-pharmacologic comfort measures Outcome: Adequate for Discharge   

## 2020-11-21 NOTE — Progress Notes (Signed)
Nursing discharge note Patient alert and oriented. Vital signs repeated and WNL. Patient denies pain at this time. Piv dcd. Site unremarkable. Dc instructions paperwork given to patient. She is waiting for daughter to bring her clothing and daughter will take her home. All patient belongings given to patient.

## 2020-11-21 NOTE — Progress Notes (Signed)
Discharge instructions (including medications) discussed with and copy provided to patient/caregiver 

## 2021-03-05 ENCOUNTER — Other Ambulatory Visit (HOSPITAL_COMMUNITY): Payer: Self-pay

## 2021-08-18 ENCOUNTER — Emergency Department (INDEPENDENT_AMBULATORY_CARE_PROVIDER_SITE_OTHER): Payer: BC Managed Care – PPO

## 2021-08-18 ENCOUNTER — Encounter: Payer: Self-pay | Admitting: Emergency Medicine

## 2021-08-18 ENCOUNTER — Other Ambulatory Visit: Payer: Self-pay

## 2021-08-18 ENCOUNTER — Emergency Department (INDEPENDENT_AMBULATORY_CARE_PROVIDER_SITE_OTHER)
Admission: EM | Admit: 2021-08-18 | Discharge: 2021-08-18 | Disposition: A | Discharge status: Home / Self Care | Payer: Medicaid Other | Source: Home / Self Care

## 2021-08-18 DIAGNOSIS — K5900 Constipation, unspecified: Secondary | ICD-10-CM

## 2021-08-18 DIAGNOSIS — R112 Nausea with vomiting, unspecified: Secondary | ICD-10-CM

## 2021-08-18 DIAGNOSIS — R1030 Lower abdominal pain, unspecified: Secondary | ICD-10-CM

## 2021-08-18 DIAGNOSIS — R197 Diarrhea, unspecified: Secondary | ICD-10-CM | POA: Diagnosis not present

## 2021-08-18 LAB — POCT URINALYSIS DIP (MANUAL ENTRY)
Bilirubin, UA: NEGATIVE
Blood, UA: NEGATIVE
Glucose, UA: NEGATIVE mg/dL
Ketones, POC UA: NEGATIVE mg/dL
Leukocytes, UA: NEGATIVE
Nitrite, UA: NEGATIVE
Protein Ur, POC: NEGATIVE mg/dL
Spec Grav, UA: 1.015 (ref 1.010–1.025)
Urobilinogen, UA: 0.2 E.U./dL
pH, UA: 6.5 (ref 5.0–8.0)

## 2021-08-18 MED ORDER — ONDANSETRON 8 MG PO TBDP: 8.0000 mg | ORAL_TABLET | Freq: Three times a day (TID) | ORAL | 0 refills | Status: DC | PRN

## 2021-08-18 NOTE — ED Provider Notes (Signed)
?[facility] ? ? ? ?CSN: [id number] ?Arrival date & time: 08/18/21  1151 ? ? ?  ? ?History   ?Chief Complaint ?Chief Complaint  ?Patient presents with  ? Abdominal Pain  ? ? ?HPI ?Beth Cordova is a 36 y.o. female.  ? ?HPI 48 old female presents with abdominal pain for the past 3 to 4 days.  Patient reports returning from [place] and was seasick on cruise.  Reports pain in bilateral abdominal lower regions.  Additionally reports of vomiting 2-3 times and trying to increase fluid. ? ?Past Medical History:  ?Diagnosis Date  ? Bowel dysfunction   ? States she has to eat 5 small meals a day  ? Bowel obstruction (HCC)   ? Hep C w/o coma, chronic (HCC)   ? History of preterm delivery 12/23/2016  ? 20 week delivery at home PPROM then delivery.   ? Ovarian cyst   ? Trichomonas infection   ? UTI (urinary tract infection)   ? ? ?Patient Active Problem List  ? Diagnosis Date Noted  ? Incarcerated hernia 11/19/2020  ? Hepatitis C antibody positive in blood 04/05/2020  ? Chronic hepatitis C virus infection (HCC) 10/20/2018  ? ? ?Past Surgical History:  ?Procedure Laterality Date  ? CESAREAN SECTION    ? CESAREAN SECTION N/A 07/01/2020  ? Procedure: CESAREAN SECTION;  Surgeon: Tereso Newcomer, MD;  [facility]: [facility];  Service: Obstetrics;  Laterality: N/A;  ? LAPAROSCOPY N/A 11/20/2020  ? Procedure: LAPAROSCOPY DIAGNOSTIC;  Surgeon: Emelia Loron, MD;  [facility]: [facility] OR;  Service: General;  Laterality: N/A;  ? NO PAST SURGERIES    ? VENTRAL HERNIA REPAIR N/A 11/20/2020  ? Procedure: HERNIA REPAIR VENTRAL ADULT POSSIBLE MESH;  Surgeon: Emelia Loron, MD;  Location: [facility] OR;  Service: General;  Laterality: N/A;  ? WISDOM TOOTH EXTRACTION    ? ? ?OB History   ? ? Gravida  ?7  ? Para  ?7  ? Term  ?6  ? Preterm  ?1  ? AB  ?0  ? Living  ?6  ?  ? ? SAB  ?0  ? IAB  ?   ? Ectopic  ?   ? Multiple  ?0  ? Live Births  ?7  ?   ?  ?  ? ? ? ?Home Medications   ? ?Prior to Admission medications   ?Medication Sig Start Date End Date  Taking? Authorizing Provider  ?acetaminophen (TYLENOL) 500 MG tablet Take 1,000 mg by mouth every 6 (six) hours as needed for moderate pain or headache.   Yes [provider]  ?ondansetron (ZOFRAN-ODT) 8 MG disintegrating tablet Take 1 tablet (8 mg total) by mouth every 8 (eight) hours as needed for nausea or vomiting. 08/18/21  Yes Trevor Iha, FNP  ?Blood Pressure Monitoring (BLOOD PRESSURE MONITOR AUTOMAT) DEVI 1 Device by Does not apply route daily. Automatic blood pressure cuff regular size. To monitor blood pressure regularly at home. ICD-10 code: O71.90 ?Patient not taking: Reported on 07/20/2020 01/17/20   Raelyn Mora, CNM  ?calcium carbonate (TUMS - DOSED IN MG ELEMENTAL CALCIUM) 500 MG chewable tablet Chew 2 tablets by mouth 2 (two) times daily as needed for indigestion or heartburn. ?Patient not taking: Reported on 07/20/2020    [provider]  ?coconut oil OIL Apply 1 application topically as needed. ?Patient not taking: Reported on 07/20/2020 07/03/20   Alric Seton, MD  ?ibuprofen (ADVIL) 800 MG tablet Take 1 tablet (800 mg total) by mouth every  8 (eight) hours as needed for mild pain or moderate pain. 11/21/20   Eric Form, PA-C  ?Misc. Devices (GOJJI WEIGHT SCALE) MISC 1 Device by Does not apply route daily as needed. To weight self daily as needed at home. ICD-10 code: O62.90 ?Patient not taking: Reported on 07/20/2020 01/17/20   Raelyn Mora, CNM  ?Multiple Vitamins-Minerals (HAIR SKIN AND NAILS FORMULA PO) Take 1 tablet by mouth daily.    [provider]  ?OVER THE COUNTER MEDICATION Take 1 tablet by mouth every Monday, Wednesday, and Friday. gummy    [provider]  ?Prenatal Vit-Fe Fumarate-FA (PRENATAL MULTIVITAMIN) TABS tablet Take 1 tablet by mouth daily.    [provider]  ?prenatal vitamin w/FE, FA (PRENATAL 1 + 1) 27-1 MG TABS tablet Take 1 tablet by mouth daily at 12 noon. 01/17/20   Raelyn Mora, CNM  ? ? ?Family History ?Family  History  ?Problem Relation Age of Onset  ? Hypertension Father   ? Asthma Brother   ? Cancer Maternal Grandmother   ?     bone  ? Hypertension Maternal Grandmother   ? Stroke Maternal Grandmother   ? Diabetes Paternal Grandmother   ? Diabetes Paternal Grandfather   ? ? ?Social History ?Social History  ? ?Tobacco Use  ? Smoking status: Former  ? Smokeless tobacco: Never  ? Tobacco comments:  ?  quit Aug 2018  ?Vaping Use  ? Vaping Use: Some days  ?Substance Use Topics  ? Alcohol use: No  ? Drug use: No  ? ? ? ?Allergies   ?Patient has no known allergies. ? ? ?Review of Systems ?Review of Systems  ?Gastrointestinal:  Positive for abdominal pain, diarrhea, nausea and vomiting.  ? ? ?Physical Exam ?Triage Vital Signs ?ED Triage Vitals  ?Enc Vitals Group  ?   BP 08/18/21 1253 115/79  ?   Pulse Rate 08/18/21 1253 63  ?   Resp 08/18/21 1253 16  ?   Temp 08/18/21 1253 98.4 ?F (36.9 ?C)  ?   Temp Source 08/18/21 1253 Oral  ?   SpO2 08/18/21 1253 97 %  ?   Weight --   ?   Height --   ?   Head Circumference --   ?   Peak Flow --   ?   Pain Score 08/18/21 1251 8  ?   Pain Loc --   ?   Pain Edu? --   ?   Excl. in GC? --   ? ?No data found. ? ?Updated Vital Signs ?BP 115/79 (BP Location: Left Arm)   Pulse 63   Temp 98.4 ?F (36.9 ?C) (Oral)   Resp 16   LMP 08/05/2021   SpO2 97%  ? ? ?Physical Exam ?Vitals reviewed.  ?Constitutional:   ?   General: She is not in acute distress. ?   Appearance: She is well-developed and normal weight. She is not ill-appearing.  ?HENT:  ?   Head: Normocephalic and atraumatic.  ?   Mouth/Throat:  ?   Mouth: Mucous membranes are moist.  ?   Pharynx: Oropharynx is clear.  ?Eyes:  ?   Extraocular Movements: Extraocular movements intact.  ?   Pupils: Pupils are equal, round, and reactive to light.  ?Cardiovascular:  ?   Rate and Rhythm: Normal rate and regular rhythm.  ?   Heart sounds: Normal heart sounds. No murmur heard. ?Pulmonary:  ?   Effort: Pulmonary effort is normal.  ?   Breath sounds:  Normal breath sounds.  ?Abdominal:  ?   General: Abdomen is flat. Bowel sounds are absent.  ?   Palpations: Abdomen is soft. There is no shifting dullness, fluid wave, hepatomegaly, splenomegaly, mass or pulsatile mass.  ?   Tenderness: There is abdominal tenderness in the right lower quadrant and left lower quadrant. There is no right CVA tenderness, left CVA tenderness, guarding or rebound. Negative signs include Rovsing's sign.  ?   Hernia: No hernia is present.  ?Skin: ?   General: Skin is warm and dry.  ?Neurological:  ?   General: No focal deficit present.  ?   Mental Status: She is alert and oriented to person, place, and time.  ? ? ? ?UC Treatments / Results  ?Labs ?(all labs ordered are listed, but only abnormal results are displayed) ?Labs Reviewed  ?POCT URINALYSIS DIP (MANUAL ENTRY) - Normal  ? ? ?EKG ? ? ?Radiology ?DG Abd 1 View ? ?Result Date: 08/18/2021 ?CLINICAL DATA:  Lower abdominal pain with associated nausea, vomiting, diarrhea and constipation. EXAM: ABDOMEN - 1 VIEW COMPARISON:  None Available. FINDINGS: Bowel gas pattern is nonobstructive. No free peritoneal air. Several pelvic phleboliths are present. Evidence of previous bilateral tubal ligation. Bony structures are normal. IMPRESSION: Nonobstructive bowel gas pattern. Electronically Signed   By: Elberta Fortisaniel  Boyle M.D.   On: 08/18/2021 13:34   ? ?Procedures ?Procedures (including critical care time) ? ?Medications Ordered in UC ?Medications - No data to display ? ?Initial Impression / Assessment and Plan / UC Course  ?I have reviewed the triage vital signs and the nursing notes. ? ?Pertinent labs & imaging results that were available during my care of the patient were reviewed by me and considered in my medical decision making (see chart for details). ? ?  ? ?MDM: 1. Lower abdominal pain-KUB revealed above. Advised/informed patient of KUB x-ray results today with hard copy provided to patient.  Advised patient UA was unremarkable and within  normal limits today. Advised patient may use Zofran daily or as needed for nausea.  Advised patient if symptoms worsen and/or unresolved please follow-up with PCP or here for further evaluation.  Patient discharge

## 2021-08-18 NOTE — Discharge Instructions (Addendum)
Advised/informed patient of KUB x-ray results today with hard copy provided to patient.  Advised patient UA was unremarkable and within normal limits today. Advised patient may use Zofran daily or as needed for nausea.  Advised patient if symptoms worsen and/or unresolved please follow-up with PCP or here for further evaluation. ?

## 2021-08-18 NOTE — ED Triage Notes (Signed)
Patient presents to Urgent Care with complaints of abdominal pain since 3-4 days. Patient reports just returning from a cruise.She was sea sick while on the cruise. Had some diarrhea this morning. Pain located in lower bilateral regions. Feeling nausea, fatigued. Having trouble keeping food down. Has vomited 2-3 times. Haven been trying to increase fluids.  ?

## 2021-11-13 IMAGING — CT CT ABD-PELV W/ CM
2 of 4 series · 16 of 46 positions shown, 18 images · IV contrast (APPLIED)
Comparison: None.

CLINICAL DATA: Concern for right-sided hernia, felt pop after
picking up child

EXAM:
CT ABDOMEN AND PELVIS WITH CONTRAST
TECHNIQUE: Multidetector CT imaging of the abdomen and pelvis was performed
using the standard protocol following bolus administration of
intravenous contrast.
CONTRAST:  100mL OMNIPAQUE IOHEXOL 300 MG/ML  SOLN

[Series 3: abdomen 5.0 · axial · 0.98mm/px · z∈[-732,-292]mm · 13 of 98 slices shown, 15 images]
[im 5/98  soft-tissue]
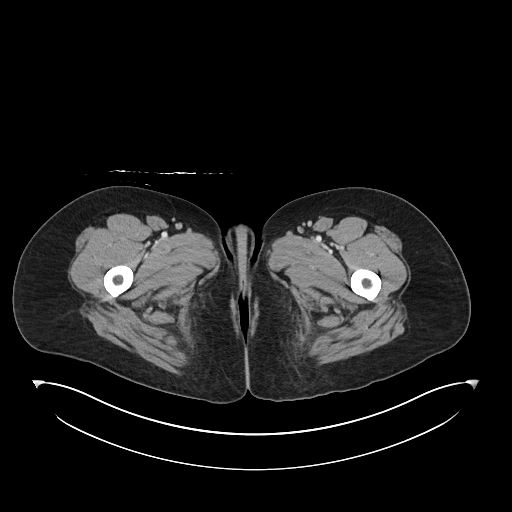
[im 5/98  bone]
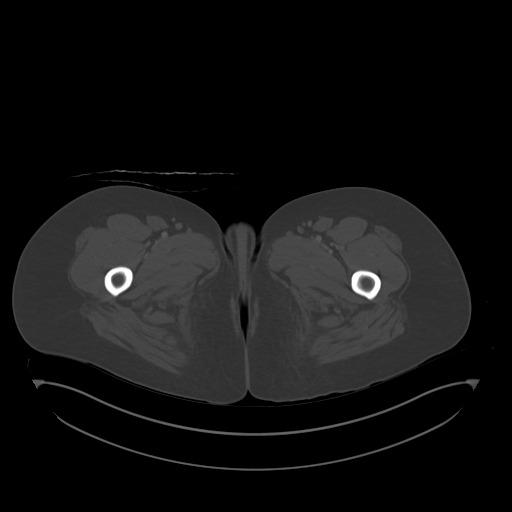
[im 15/98  soft-tissue]
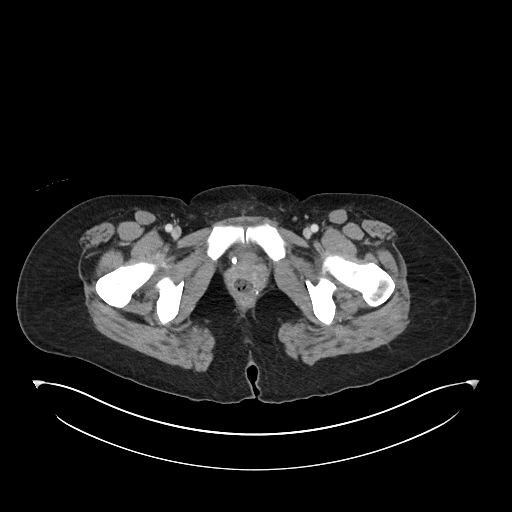
[im 20/98  soft-tissue]
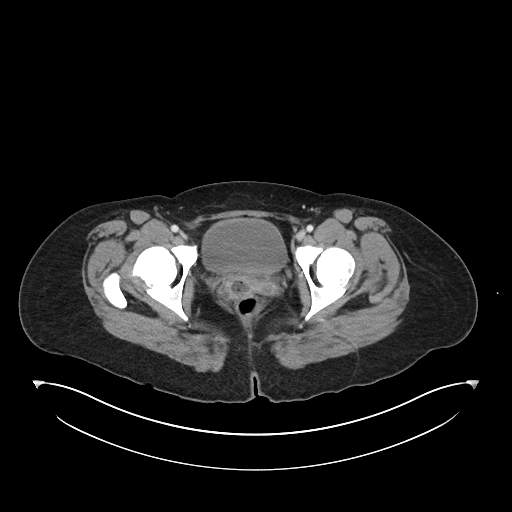
[im 30/98  soft-tissue]
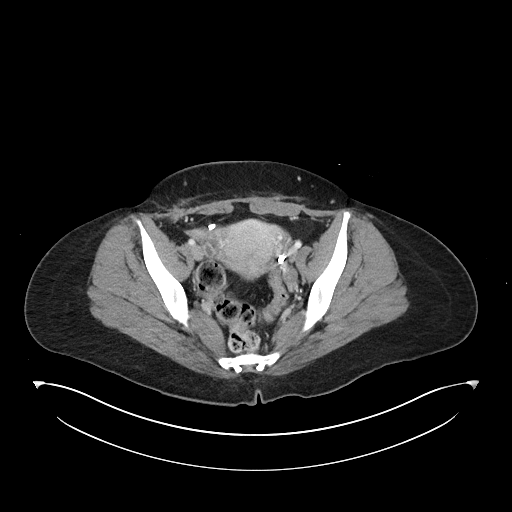
[im 34/98  soft-tissue]
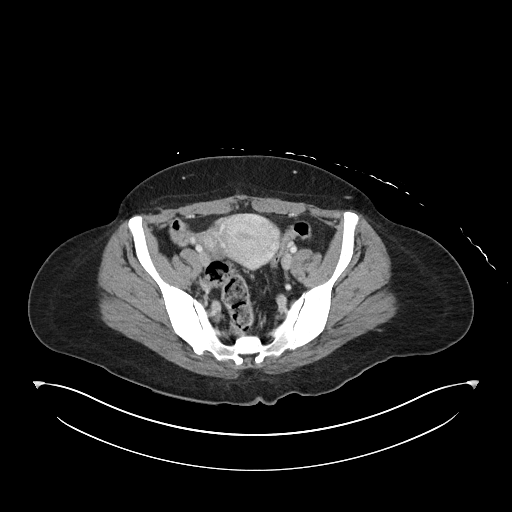
[im 44/98  soft-tissue]
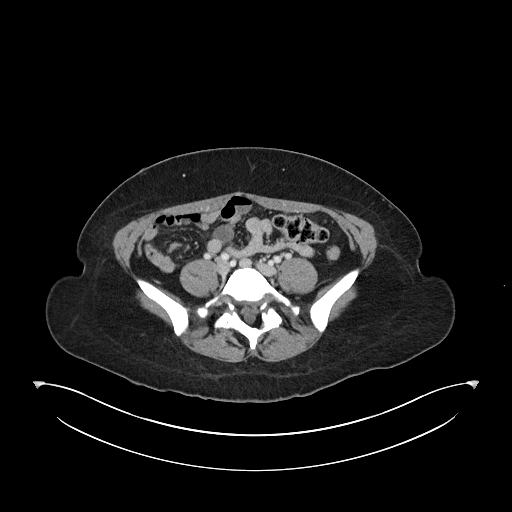
[im 49/98  soft-tissue]
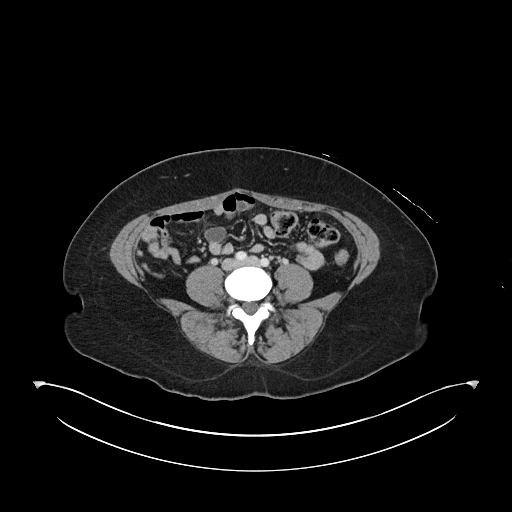
[im 54/98  soft-tissue]
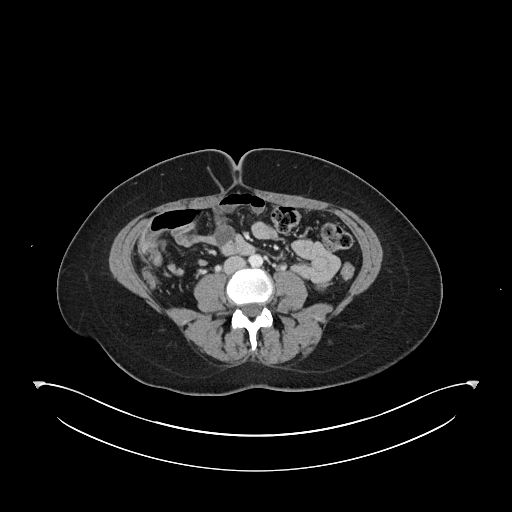
[im 64/98  soft-tissue]
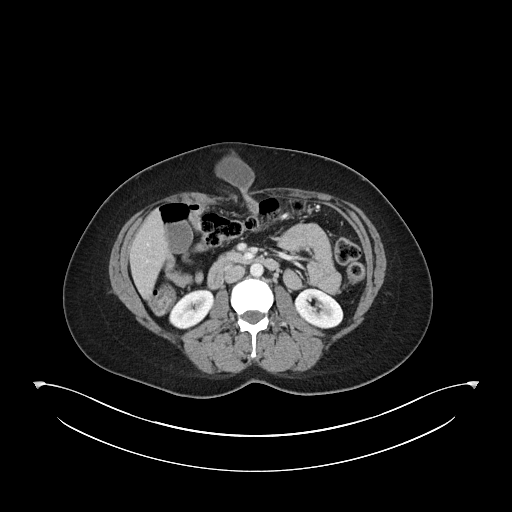
[im 64/98  bone]
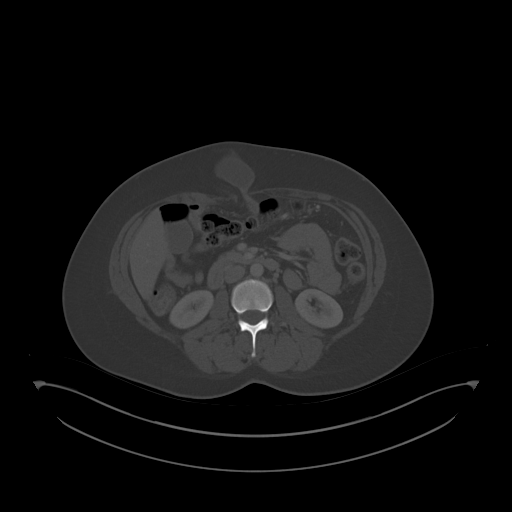
[im 68/98  soft-tissue]
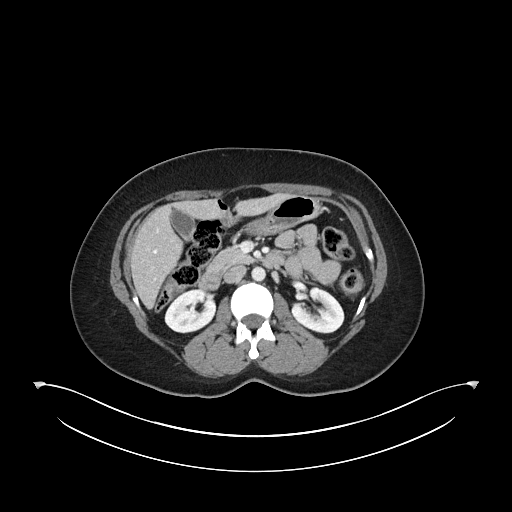
[im 78/98  soft-tissue]
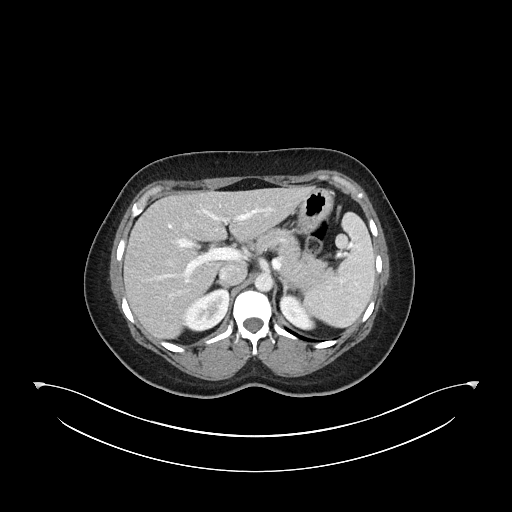
[im 83/98  soft-tissue]
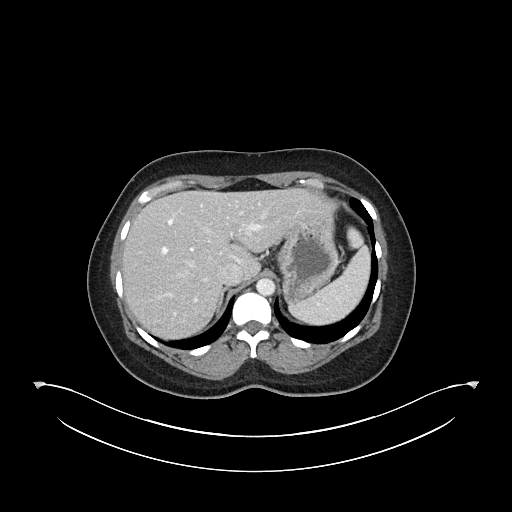
[im 93/98  soft-tissue]
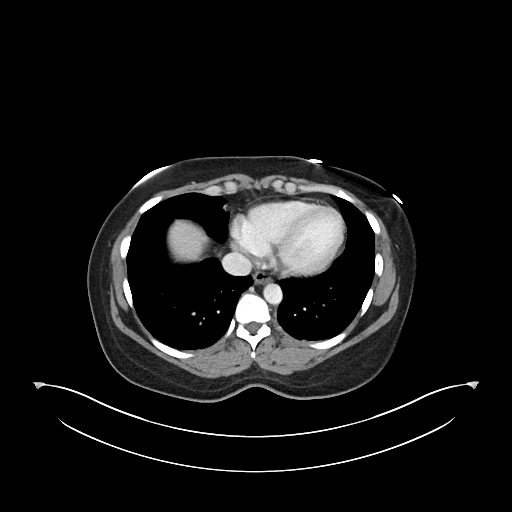

[Series 6: abdomen 3.0 mpr cor · coronal · 0.89mm/px · 3 of 91 slices shown]
[im 31/91  soft-tissue]
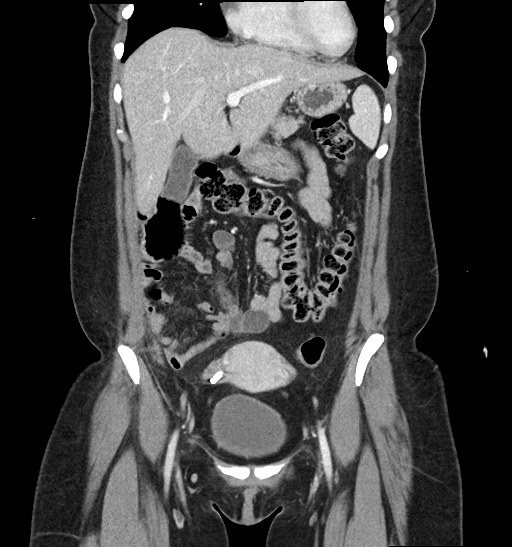
[im 41/91  soft-tissue]
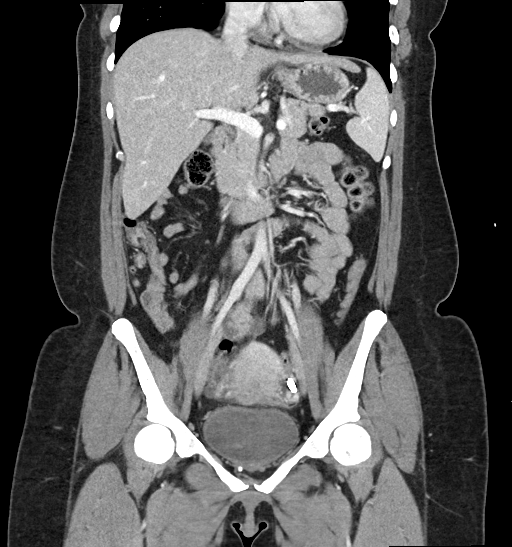
[im 51/91  soft-tissue]
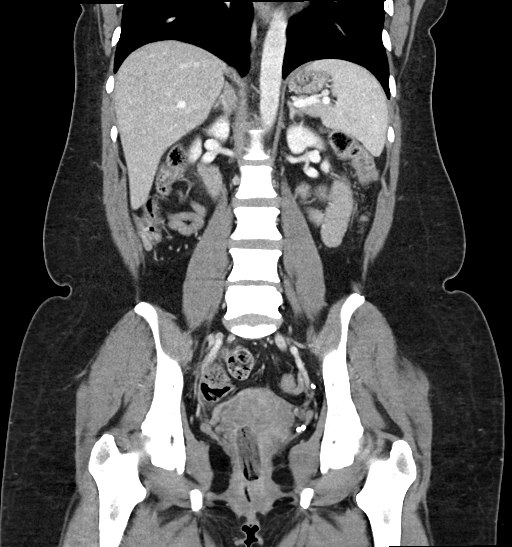

[16 of 46 positions shown; findings below may reference images not displayed]

FINDINGS: Lower chest: No acute abnormality.

Hepatobiliary: Hypodense 2.4 cm lesion in the posterior aspect of
the right lobe of the liver on image [DATE] which appears to
demonstrate some peripheral nodular discontinuous enhancement best
seen on coronal image 66. Similar appearing lesion in the inferior
aspect of the right lobe of the liver measuring 10 mm on image [DATE]
and additional similar appearing lesion in the inferior aspect of
the left lobe of the liver measuring 8 mm on image [DATE]. Gallbladder
is unremarkable. No biliary ductal dilation.

Pancreas: Within normal limits.

Spleen: Within normal limits.

Adrenals/Urinary Tract: Adrenal glands are unremarkable. Kidneys are
normal, without renal calculi, solid enhancing lesion, or
hydronephrosis. Bladder is unremarkable for degree of distension.

Stomach/Bowel: Stomach is unremarkable. No pathologic dilation of
small bowel. Ventral hernia containing fat and prominent
fluid-filled small bowel measuring up to 2.2 cm with an aperture
width of 15 mm. Normal appendix. No evidence of acute large bowel
inflammation.

Vascular/Lymphatic: No significant vascular findings are present. No
pathologically enlarged abdominal or pelvic lymph nodes.

Reproductive: Uterus and bilateral adnexa are unremarkable. Tampon
in the vagina.

Other: Anterior abdominal wall hernia, described below above. No
abdominopelvic ascites.

Musculoskeletal: No acute osseous abnormality.
IMPRESSION: 1. Ventral hernia containing fat and a prominent fluid-filled loop
of small bowel measuring up to 2.2 cm with a aperture width of 15
mm. No pathologic dilation of small bowel, however early obstruction
not excluded on this examination. Recommend clinical correlation for
reducibility and surgical consult.
2. Multiple hypodense hepatic lesions which are incompletely
evaluated on this single-phase exam but likely represent benign
hepatic angiomata. Consider more definitive characterization with
nonemergent hepatic protocol MRI with and without contrast.

## 2022-03-27 ENCOUNTER — Ambulatory Visit (INDEPENDENT_AMBULATORY_CARE_PROVIDER_SITE_OTHER): Payer: BC Managed Care – PPO | Admitting: Obstetrics and Gynecology

## 2022-03-27 ENCOUNTER — Other Ambulatory Visit (HOSPITAL_COMMUNITY)
Admission: RE | Admit: 2022-03-27 | Discharge: 2022-03-27 | Disposition: A | Discharge status: Home / Self Care | Payer: BC Managed Care – PPO | Source: Ambulatory Visit | Attending: Obstetrics and Gynecology | Admitting: Obstetrics and Gynecology

## 2022-03-27 ENCOUNTER — Encounter: Payer: Self-pay | Admitting: Obstetrics and Gynecology

## 2022-03-27 VITALS — BP 109/69 | HR 65 | Ht 63.0 in

## 2022-03-27 DIAGNOSIS — N393 Stress incontinence (female) (male): Secondary | ICD-10-CM | POA: Diagnosis not present

## 2022-03-27 DIAGNOSIS — N898 Other specified noninflammatory disorders of vagina: Secondary | ICD-10-CM

## 2022-03-27 DIAGNOSIS — Z113 Encounter for screening for infections with a predominantly sexual mode of transmission: Secondary | ICD-10-CM

## 2022-03-27 DIAGNOSIS — N6320 Unspecified lump in the left breast, unspecified quadrant: Secondary | ICD-10-CM

## 2022-03-27 NOTE — Progress Notes (Signed)
Pt c/o vaginal discharge with no odor or itching Pt denies UTI symptoms Pt states left breast is sore Pt request STI testing on Aptima- declined blood work

## 2022-03-27 NOTE — Progress Notes (Signed)
RETURN GYNECOLOGY VISIT  Subjective:  Beth Cordova is a 36 y.o. presenting for evaluation of vaginal discharge, breast tenderness and urinary leakage.   Reports white discharge with no odor or itching. Is separated from the father of her children and is worried about possible STI. Has noticed left breast soreness and possible "knot" for the past 2 cycles. Has bilateral milky nipple discharge. Is not breast feeding. Has had more urinary leakage since her last delivery. Leaks with cough/laugh/sneeze, jumping & running. Denies dysuria/hematuria.  Past Medical History:  Diagnosis Date   Bowel dysfunction    States she has to eat 5 small meals a day   Bowel obstruction (HCC)    Hep C w/o coma, chronic (HCC)    History of preterm delivery 12/23/2016   20 week delivery at home PPROM then delivery.    Ovarian cyst    Trichomonas infection    UTI (urinary tract infection)    Past Surgical History:  Procedure Laterality Date   CESAREAN SECTION     CESAREAN SECTION N/A 07/01/2020   Procedure: CESAREAN SECTION;  Surgeon: Tereso Newcomer, MD;  Location: [facility] LD ORS;  Service: Obstetrics;  Laterality: N/A;   LAPAROSCOPY N/A 11/20/2020   Procedure: LAPAROSCOPY DIAGNOSTIC;  Surgeon: Emelia Loron, MD;  Location: [facility] OR;  Service: General;  Laterality: N/A;   NO PAST SURGERIES     VENTRAL HERNIA REPAIR N/A 11/20/2020   Procedure: HERNIA REPAIR VENTRAL ADULT POSSIBLE MESH;  Surgeon: Emelia Loron, MD;  Location: [facility] OR;  Service: General;  Laterality: N/A;   WISDOM TOOTH EXTRACTION     Current Outpatient Medications on File Prior to Visit  Medication Sig Dispense Refill   ibuprofen (ADVIL) 800 MG tablet Take 1 tablet (800 mg total) by mouth every 8 (eight) hours as needed for mild pain or moderate pain. 30 tablet 0   No current facility-administered medications on file prior to visit.   OB: [id number] Pap Hx: 08/03/2020  Cytology - PAP( [place])  HIGH RISK HPV ([place]):  Negative DIAGNOSIS: - Atypical squamous cells of undetermined significance (ASC-US) Important    05/12/2017  Cytology - PAP  DIAGNOSIS: NEGATIVE FOR INTRAEPITHELIAL LESIONS OR MALIGNANCY. HPV [facility]): NOT DETECTED   Next pap 2025  Objective:   Vitals:   03/27/22 0946  BP: 109/69  Pulse: 65  Height: 5\' 3"  (1.6 m)   General:  Alert, oriented and cooperative. Patient is in no acute distress.  Breast: Left breast nodule noted at 2 o'clock, 4cm from nipple. Indistinct borders but mobile. Mildly tender. No nipple discharge expressed. Normal right breast exam.   Cardiovascular: Normal heart rate noted  Respiratory: Normal respiratory effort, no problems with respiration noted  Abdomen: Soft, nontender  Pelvic: NEFG. Thin physiologic appearing discharge.   Assessment and Plan:  Beth Cordova is a 36 y.o. with vaginal discharge, desire for STI screening, left breast mass & tenderness, and stress urinary incontinence  1. Vaginal discharge - Cervicovaginal ancillary only( [place])  2. Mass of left breast, unspecified quadrant - 31 BREAST LTD UNI LEFT INC AXILLA; Future - MM Digital Diagnostic Unilat L; Future  2. SUI (stress urinary incontinence, female) Briefly reviewed PFPT, lifestyle modifications, pessary, surgical options. Pt will follow up at her convenience for more in depth discussion - Ambulatory referral to Physical Therapy  4. Routine screening for STI (sexually transmitted infection) - Cervicovaginal ancillary only( [place]) - HIV antibody (with reflex); Future - RPR; Future - Hepatitis B Surface AntiGEN; Future  Return in about 3 months (around 06/26/2022) for return gyn at her convenience.  Future Appointments  Date Time Provider Department Center  04/18/2022 10:15 AM Desenglau, Shireen Quan, PT [facility] None  05/14/2022 11:00 AM [facility] NURSE [facility]   Lennart Pall, MD

## 2022-03-28 LAB — CERVICOVAGINAL ANCILLARY ONLY
Bacterial Vaginitis (gardnerella): NEGATIVE
Candida Glabrata: NEGATIVE
Candida Vaginitis: NEGATIVE
Chlamydia: NEGATIVE
Comment: NEGATIVE
Comment: NEGATIVE
Comment: NEGATIVE
Comment: NEGATIVE
Comment: NEGATIVE
Comment: NORMAL
Neisseria Gonorrhea: NEGATIVE
Trichomonas: NEGATIVE

## 2022-04-16 ENCOUNTER — Other Ambulatory Visit: Payer: BC Managed Care – PPO

## 2022-04-16 ENCOUNTER — Ambulatory Visit
Admission: RE | Admit: 2022-04-16 | Discharge: 2022-04-16 | Disposition: A | Discharge status: Home / Self Care | Payer: BC Managed Care – PPO | Source: Ambulatory Visit | Attending: Obstetrics and Gynecology | Admitting: Obstetrics and Gynecology

## 2022-04-16 DIAGNOSIS — N6320 Unspecified lump in the left breast, unspecified quadrant: Secondary | ICD-10-CM

## 2022-04-18 ENCOUNTER — Ambulatory Visit: Payer: Medicaid Other | Admitting: Physical Therapy

## 2022-05-13 ENCOUNTER — Ambulatory Visit: Payer: Commercial Managed Care - PPO | Attending: Obstetrics and Gynecology | Admitting: Physical Therapy

## 2022-05-13 ENCOUNTER — Encounter: Payer: Self-pay | Admitting: Physical Therapy

## 2022-05-13 DIAGNOSIS — M6281 Muscle weakness (generalized): Secondary | ICD-10-CM | POA: Insufficient documentation

## 2022-05-13 DIAGNOSIS — R279 Unspecified lack of coordination: Secondary | ICD-10-CM | POA: Insufficient documentation

## 2022-05-13 DIAGNOSIS — R293 Abnormal posture: Secondary | ICD-10-CM | POA: Insufficient documentation

## 2022-05-13 DIAGNOSIS — N393 Stress incontinence (female) (male): Secondary | ICD-10-CM | POA: Insufficient documentation

## 2022-05-13 NOTE — Therapy (Signed)
r OUTPATIENT PHYSICAL THERAPY FEMALE PELVIC EVALUATION   Patient Name: Beth Cordova MRN: [id number] DOB:1985-11-04, 37 y.o., female Today's Date: 05/13/2022  END OF SESSION:  PT End of Session - 05/13/22 1217     Visit Number 1    Date for PT Re-Evaluation 08/05/22    Authorization Type Medicaid UHC community    PT Start Time 1202   late   PT Stop Time 1231    PT Time Calculation (min) 29 min    Activity Tolerance Patient tolerated treatment well    Behavior During Therapy WFL for tasks assessed/performed             Past Medical History:  Diagnosis Date   Bowel dysfunction    States she has to eat 5 small meals a day   Bowel obstruction (HCC)    Hep C w/o coma, chronic ([place])    History of preterm delivery 12/23/2016   20 week delivery at home PPROM then delivery.    Ovarian cyst    Trichomonas infection    UTI (urinary tract infection)    Past Surgical History:  Procedure Laterality Date   CESAREAN SECTION     CESAREAN SECTION N/A 07/01/2020   Procedure: CESAREAN SECTION;  Surgeon: Osborne Oman, MD;  [facility];  Service: Obstetrics;  Laterality: N/A;   LAPAROSCOPY N/A 11/20/2020   Procedure: LAPAROSCOPY DIAGNOSTIC;  Surgeon: Rolm Bookbinder, MD;  Location: [place];  Service: General;  Laterality: N/A;   NO PAST SURGERIES     VENTRAL HERNIA REPAIR N/A 11/20/2020   Procedure: HERNIA REPAIR VENTRAL ADULT POSSIBLE MESH;  Surgeon: Rolm Bookbinder, MD;  Location: [place];  Service: General;  Laterality: N/A;   [place] EXTRACTION     Patient Active Problem List   Diagnosis Date Noted   Incarcerated hernia 11/19/2020   Hepatitis C antibody positive in blood 04/05/2020   Chronic hepatitis C virus infection ([place]) 10/20/2018    PCP: None  REFERRING PROVIDER: Inez Catalina, MD   REFERRING DIAG: N39.3 (ICD-10-CM) - SUI (stress urinary incontinence, female)   THERAPY DIAG:  Muscle weakness (generalized)  Unspecified lack of  coordination  Abnormal posture  Rationale for Evaluation and Treatment: Rehabilitation  ONSET DATE: 2 years ago  SUBJECTIVE:                                                                                                                                                                                           SUBJECTIVE STATEMENT: Pt has had this since c-sections.  It has gotten better but not totally gone.  Still leaking every time there is a  lot of stress. Pt also has dyspareunia occasionally but is extreme pain when it happens Fluid intake:    PAIN:  Are you having pain? No  PRECAUTIONS: None  WEIGHT BEARING RESTRICTIONS: No  FALLS:  Has patient fallen in last 6 months? No  LIVING ENVIRONMENT: Lives with: lives with their family Lives in: House/apartment   OCCUPATION:   PLOF: Independent  PATIENT GOALS: get picked up, run, jumping  PERTINENT HISTORY:  PMH: bowel obstruction; c-section x 2, ovarian cyst, ventral hernia repair Sexual abuse: No  BOWEL MOVEMENT: Pain with bowel movement: No Type of bowel movement:Type (Bristol Stool Scale) sometimes constipation, Frequency 1/day at least, and Strain Yes sometimes Fully empty rectum: Yes: most of the time Leakage:  Pads:  Fiber supplement:   URINATION: Pain with urination: No Fully empty bladder: Yes:   Stream: Strong Urgency: Yes: sometimes Frequency: normal 1/night sometimes Leakage:  running and jumping Pads: Yes: only when going outside to run - 1 pad  INTERCOURSE: Pain with intercourse:  one spot that can hurt depending on the position Ability to have vaginal penetration:  Yes:     PREGNANCY: Vaginal deliveries 4 Tearing No C-section deliveries 2 Currently pregnant No  PROLAPSE: None   OBJECTIVE:   DIAGNOSTIC FINDINGS:    PATIENT SURVEYS:    PFIQ-7   COGNITION: Overall cognitive status: Within functional limits for tasks assessed     SENSATION: Light touch:  Proprioception:    MUSCLE LENGTH: Hamstrings:  Thomas test:   LUMBAR SPECIAL TESTS:    FUNCTIONAL TESTS:    GAIT:  Comments: WFL   POSTURE: increased lumbar lordosis, anterior pelvic tilt, and left pelvic obliquity  PELVIC ALIGNMENT: Lt ilium anterior rotation  LUMBARAROM/PROM:  A/PROM A/PROM  eval  Flexion WFL   Extension   Right lateral flexion   Left lateral flexion   Right rotation   Left rotation    (Blank rows = not tested)  LOWER EXTREMITY ROM:   ROM Right eval Left eval  Hip flexion    Hip extension    Hip abduction    Hip adduction    Hip internal rotation    Hip external rotation    Knee flexion    Knee extension    Ankle dorsiflexion    Ankle plantarflexion    Ankle inversion    Ankle eversion     (Blank rows = not tested)  LOWER EXTREMITY MMT:  MMT Right eval Left eval  Hip flexion    Hip extension    Hip abduction    Hip adduction    Hip internal rotation    Hip external rotation    Knee flexion    Knee extension    Ankle dorsiflexion    Ankle plantarflexion    Ankle inversion    Ankle eversion     PALPATION:   General  tension in lumbar                External Perineal Exam normal appearance other than slightly pale color                             Internal Pelvic Floor left ileococcygeus tight and tender to palpation, Rt more tender without as much rigid tightness, more boggy feeling on the Rt  Patient confirms identification and approves PT to assess internal pelvic floor and treatment Yes  PELVIC MMT:   MMT eval  Vaginal 2/5, 5 reps (holding breath), 5  sec also holding breath  Internal Anal Sphincter   External Anal Sphincter   Puborectalis   Diastasis Recti Lower abdomen 1 finger (had hernia surgery)  (Blank rows = not tested)        TONE: high  PROLAPSE: Anterior wall  TODAY'S TREATMENT:                                                                                                                              DATE:  05/13/22  EVAL    PATIENT EDUCATION:  Education details: discussed making sure to stretch more - will give more specifics at follow up Person educated: Patient Education method: Explanation Education comprehension: verbalized understanding and needs further education  HOME EXERCISE PROGRAM: None given today  ASSESSMENT:  CLINICAL IMPRESSION: Patient is a 37 y.o. female who was seen today for physical therapy evaluation and treatment for stress urinary incontinence.  Limited muscle testing in LE due to time.  Pt has posture abnormalities as noted above. Pt has pelvic floor and core weakness and decreased endurance.  Pt has muscle spasms in pelvic floor and low back.  Pt has pelvic obliquity on Left side with anterior rotation and upslip of pelvis on left.  Pt has decreased pelvic floor muscle strength and coordination impairment with holding breath when attempting to kegel. Pt will benefit from skilled PT to address all above mentioned impairments in order to return to maximum function without leakage  OBJECTIVE IMPAIRMENTS: decreased coordination, decreased endurance, decreased strength, increased muscle spasms, impaired tone, and postural dysfunction.   ACTIVITY LIMITATIONS: continence and exercise and running or fast movements  PARTICIPATION LIMITATIONS: interpersonal relationship and community activity  PERSONAL FACTORS: Time since onset of injury/illness/exacerbation and 3+ comorbidities: PMH: bowel obstruction; c-section x 2, ovarian cyst, ventral hernia repair  are also affecting patient's functional outcome.   REHAB POTENTIAL: Excellent  CLINICAL DECISION MAKING: Evolving/moderate complexity  EVALUATION COMPLEXITY: Moderate   GOALS: Goals reviewed with patient? Yes  SHORT TERM GOALS: Target date: 06/10/22  Able to do kegel without holding breath Baseline: Goal status: INITIAL  2.  Pt will be ind with initial HEP Baseline:  Goal status: INITIAL     LONG TERM  GOALS: Target date: 08/05/22  Pt will be able to get picked up or do quick movements like jumping without leakage Baseline:  Goal status: INITIAL  2.  Pt will be independent with advanced HEP to maintain improvements made throughout therapy  Baseline:  Goal status: INITIAL  3.  Pt will demonstrate kegel for at lest 15 sec without holding breath for endurance related exercises Baseline:  Goal status: INITIAL  4.  Pt will be able to jog slowly for 5 min without leakage in order to work towards faster runs Baseline:  Goal status: INITIAL  5.  Pt will report pain during intercourse down to 5/10. Baseline: 10/10 Goal status: INITIAL    PLAN:  PT FREQUENCY: 1x/week  PT DURATION: 12 weeks  PLANNED INTERVENTIONS: Therapeutic exercises, Therapeutic activity, Neuromuscular re-education, Balance training, Gait training, Patient/Family education, Self Care, Joint mobilization, Dry Needling, Electrical stimulation, Cryotherapy, Moist heat, scar mobilization, Taping, Biofeedback, Manual therapy, and Re-evaluation  PLAN FOR NEXT SESSION: lumbar and hip stretches and breathing/bulging pelvic floor, internal STM to work on breathing and bulging and getting better circular contraction with exhale, issue HEP   Brayton Caves Cynthia Cogle, PT 05/13/2022, 5:35 PM

## 2022-05-14 ENCOUNTER — Other Ambulatory Visit: Payer: BC Managed Care – PPO

## 2022-05-14 NOTE — Progress Notes (Deleted)
Pt here for labs. Lab slip given to pt and pt sent to [facility].

## 2022-05-22 ENCOUNTER — Other Ambulatory Visit: Payer: BC Managed Care – PPO

## 2022-06-10 ENCOUNTER — Ambulatory Visit
Admission: EM | Admit: 2022-06-10 | Discharge: 2022-06-10 | Disposition: A | Discharge status: Home / Self Care | Payer: Medicaid Other | Attending: Family Medicine | Admitting: Family Medicine

## 2022-06-10 ENCOUNTER — Encounter: Payer: Self-pay | Admitting: Emergency Medicine

## 2022-06-10 DIAGNOSIS — J111 Influenza due to unidentified influenza virus with other respiratory manifestations: Secondary | ICD-10-CM | POA: Diagnosis not present

## 2022-06-10 LAB — POCT INFLUENZA A/B
Influenza A, POC: POSITIVE — AB
Influenza B, POC: NEGATIVE

## 2022-06-10 LAB — POC SARS CORONAVIRUS 2 AG -  ED: SARS Coronavirus 2 Ag: NEGATIVE

## 2022-06-10 MED ORDER — ACETAMINOPHEN 325 MG PO TABS
650.0000 mg | ORAL_TABLET | Freq: Once | ORAL | Status: AC
  Administered 2022-06-10: 650 mg via ORAL

## 2022-06-10 MED ORDER — OSELTAMIVIR PHOSPHATE 75 MG PO CAPS: 75.0000 mg | ORAL_CAPSULE | Freq: Two times a day (BID) | ORAL | 0 refills | Status: AC

## 2022-06-10 NOTE — Discharge Instructions (Signed)
Take Tamiflu 2 times a day Take Tylenol or ibuprofen for pain and fever Get lots of rest Drink lots of fluids Call if not improved at the end of the week

## 2022-06-10 NOTE — ED Triage Notes (Signed)
Cough since Saturday  Body aches  Tmax 100 Tylenol for body aches helped  Nyquil - min relief  Throat is sore from coughing

## 2022-06-10 NOTE — ED Provider Notes (Signed)
[name]    CSN: [id number] Arrival date & time: 06/10/22  1815      History   Chief Complaint Chief Complaint  Patient presents with   Cough    HPI Beth Cordova is a 37 y.o. female.   HPI Patient is on her third day of illness.  She is a harsh cough with chest pain when she coughs.  Body aches and fatigue all over.  She states "I do not know how I got out of bed "her temperatures been over 100.  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  The children hve been sick.  It is gotten sore just today, she thinks from coughing  Past Medical History:  Diagnosis Date   Bowel dysfunction    States she has to eat 5 small meals a day   Bowel obstruction (HCC)    Hep C w/o coma, chronic ([place])    History of preterm delivery 12/23/2016   20 week delivery at home PPROM then delivery.    Ovarian cyst    Trichomonas infection    UTI (urinary tract infection)     Patient Active Problem List   Diagnosis Date Noted   Incarcerated hernia 11/19/2020   Hepatitis C antibody positive in blood 04/05/2020   Chronic hepatitis C virus infection ([place]) 10/20/2018    Past Surgical History:  Procedure Laterality Date   CESAREAN SECTION     CESAREAN SECTION N/A 07/01/2020   Procedure: CESAREAN SECTION;  Surgeon: Osborne Oman, MD;  [facility];  Service: Obstetrics;  Laterality: N/A;   LAPAROSCOPY N/A 11/20/2020   Procedure: LAPAROSCOPY DIAGNOSTIC;  Surgeon: Rolm Bookbinder, MD;  Location: [place];  Service: General;  Laterality: N/A;   NO PAST SURGERIES     VENTRAL HERNIA REPAIR N/A 11/20/2020   Procedure: HERNIA REPAIR VENTRAL ADULT POSSIBLE MESH;  Surgeon: Rolm Bookbinder, MD;  Location: [place];  Service: General;  Laterality: N/A;   WISDOM TOOTH EXTRACTION      OB History     Gravida  7   Para  7   Term  6   Preterm  1   AB  0   Living  6      SAB  0   IAB      Ectopic      Multiple  0    Live Births  7            Home Medications    Prior to Admission medications   Medication Sig Start Date End Date Taking? Authorizing Provider  oseltamivir (TAMIFLU) 75 MG capsule Take 1 capsule (75 mg total) by mouth every 12 (twelve) hours. 06/10/22  Yes Raylene Everts, MD    Family History Family History  Problem Relation Age of Onset   Hypertension Father    Asthma Brother    Cancer Maternal Grandmother        bone   Hypertension Maternal Grandmother    Stroke Maternal Grandmother    Diabetes Paternal Grandmother    Diabetes Paternal Grandfather     Social History Social History   Tobacco Use   Smoking status: Every Day    Types: Cigarettes, E-cigarettes   Smokeless tobacco: Never   Tobacco comments:    quit Aug 2018  Vaping Use   Vaping Use: Some days   Substances: Nicotine, Flavoring  Substance Use Topics   Alcohol use: No   Drug  use: No     Allergies   Patient has no known allergies.   Review of Systems Review of Systems See HPI  Physical Exam Triage Vital Signs ED Triage Vitals  Enc Vitals Group     BP 06/10/22 1834 104/74     Pulse Rate 06/10/22 1834 77     Resp 06/10/22 1834 16     Temp 06/10/22 1834 98.9 F (37.2 C)     Temp Source 06/10/22 1834 Oral     SpO2 06/10/22 1834 99 %     Weight 06/10/22 1836 148 lb (67.1 kg)     Height 06/10/22 1836 '5\' 3"'$  (1.6 m)     Head Circumference --      Peak Flow --      Pain Score 06/10/22 1836 4     Pain Loc --      Pain Edu? --      Excl. in [place]? --    No data found.  Updated Vital Signs BP 104/74 (BP Location: Right Arm)   Pulse 77   Temp 98.9 F (37.2 C) (Oral)   Resp 16   Ht '5\' 3"'$  (1.6 m)   Wt 67.1 kg   LMP 05/20/2022 (Approximate) Comment: tubal ligation w/ 2nd c-section  SpO2 99%   Breastfeeding No   BMI 26.22 kg/m       Physical Exam Constitutional:      General: She is not in acute distress.    Appearance: She is well-developed. She is ill-appearing.  HENT:      Head: Normocephalic and atraumatic.     Right Ear: Tympanic membrane and ear canal normal.     Left Ear: Tympanic membrane and ear canal normal.     Nose: Nose normal. No congestion or rhinorrhea.     Mouth/Throat:     Pharynx: No posterior oropharyngeal erythema.  Eyes:     Conjunctiva/sclera: Conjunctivae normal.     Pupils: Pupils are equal, round, and reactive to light.  Cardiovascular:     Rate and Rhythm: Normal rate.     Heart sounds: Normal heart sounds.  Pulmonary:     Effort: Pulmonary effort is normal. No respiratory distress.     Breath sounds: Normal breath sounds.  Abdominal:     General: There is no distension.     Palpations: Abdomen is soft.  Musculoskeletal:        General: Normal range of motion.     Cervical back: Normal range of motion.  Skin:    General: Skin is warm and dry.  Neurological:     Mental Status: She is alert.      UC Treatments / Results  Labs (all labs ordered are listed, but only abnormal results are displayed) Labs Reviewed  POCT INFLUENZA A/B - Abnormal; Notable for the following components:      Result Value   Influenza A, POC Positive (*)    All other components within normal limits  POC SARS CORONAVIRUS 2 AG -  ED    EKG   Radiology No results found.  Procedures Procedures (including critical care time)  Medications Ordered in UC Medications  acetaminophen (TYLENOL) tablet 650 mg (650 mg Oral Given 06/10/22 1852)    Initial Impression / Assessment and Plan / UC Course  I have reviewed the triage vital signs and the nursing notes.  Pertinent labs & imaging results that were available during my care of the patient were reviewed by me and considered in my medical  decision making (see chart for details).     Influenza is positive.  Test result discussed.  Tamiflu was prescribed. Final Clinical Impressions(s) / UC Diagnoses   Final diagnoses:  Influenza     Discharge Instructions      Take Tamiflu 2 times a  day Take Tylenol or ibuprofen for pain and fever Get lots of rest Drink lots of fluids Call if not improved at the end of the week     ED Prescriptions     Medication Sig Dispense Auth. Provider   oseltamivir (TAMIFLU) 75 MG capsule Take 1 capsule (75 mg total) by mouth every 12 (twelve) hours. 10 capsule Raylene Everts, MD      PDMP not reviewed this encounter.   Raylene Everts, MD 06/10/22 [phone]

## 2022-06-19 ENCOUNTER — Other Ambulatory Visit: Payer: BC Managed Care – PPO

## 2022-06-19 ENCOUNTER — Ambulatory Visit: Payer: Commercial Managed Care - PPO | Admitting: Physical Therapy

## 2022-06-19 NOTE — Therapy (Deleted)
r OUTPATIENT PHYSICAL THERAPY FEMALE PELVIC TREATMENT   Patient Name: Beth Cordova MRN: [id number] DOB:07/02/1985, 37 y.o., female Today's Date: 06/19/2022  END OF SESSION:    Past Medical History:  Diagnosis Date   Bowel dysfunction    States she has to eat 5 small meals a day   Bowel obstruction (HCC)    Hep C w/o coma, chronic ([place])    History of preterm delivery 12/23/2016   20 week delivery at home PPROM then delivery.    Ovarian cyst    Trichomonas infection    UTI (urinary tract infection)    Past Surgical History:  Procedure Laterality Date   CESAREAN SECTION     CESAREAN SECTION N/A 07/01/2020   Procedure: CESAREAN SECTION;  Surgeon: Osborne Oman, MD;  [facility];  Service: Obstetrics;  Laterality: N/A;   LAPAROSCOPY N/A 11/20/2020   Procedure: LAPAROSCOPY DIAGNOSTIC;  Surgeon: Rolm Bookbinder, MD;  Location: [place];  Service: General;  Laterality: N/A;   NO PAST SURGERIES     VENTRAL HERNIA REPAIR N/A 11/20/2020   Procedure: HERNIA REPAIR VENTRAL ADULT POSSIBLE MESH;  Surgeon: Rolm Bookbinder, MD;  Location: [place];  Service: General;  Laterality: N/A;   [place] EXTRACTION     Patient Active Problem List   Diagnosis Date Noted   Incarcerated hernia 11/19/2020   Hepatitis C antibody positive in blood 04/05/2020   Chronic hepatitis C virus infection ([place]) 10/20/2018    PCP: None  REFERRING PROVIDER: Inez Catalina, MD   REFERRING DIAG: N39.3 (ICD-10-CM) - SUI (stress urinary incontinence, female)   THERAPY DIAG:  No diagnosis found.  Rationale for Evaluation and Treatment: Rehabilitation  ONSET DATE: 2 years ago  SUBJECTIVE:                                                                                                                                                                                           SUBJECTIVE STATEMENT: Pt has had this since c-sections.  It has gotten better but not totally gone.  Still leaking every  time there is a lot of stress. Pt also has dyspareunia occasionally but is extreme pain when it happens Fluid intake:    PAIN:  Are you having pain? No  PRECAUTIONS: None  WEIGHT BEARING RESTRICTIONS: No  FALLS:  Has patient fallen in last 6 months? No  LIVING ENVIRONMENT: Lives with: lives with their family Lives in: House/apartment   OCCUPATION:   PLOF: Independent  PATIENT GOALS: get picked up, run, jumping  PERTINENT HISTORY:  PMH: bowel obstruction; c-section x 2, ovarian cyst, ventral hernia repair Sexual abuse: No  BOWEL MOVEMENT: Pain with bowel movement: No Type of bowel movement:Type (Bristol Stool Scale) sometimes constipation, Frequency 1/day at least, and Strain Yes sometimes Fully empty rectum: Yes: most of the time Leakage:  Pads:  Fiber supplement:   URINATION: Pain with urination: No Fully empty bladder: Yes:   Stream: Strong Urgency: Yes: sometimes Frequency: normal 1/night sometimes Leakage:  running and jumping Pads: Yes: only when going outside to run - 1 pad  INTERCOURSE: Pain with intercourse:  one spot that can hurt depending on the position Ability to have vaginal penetration:  Yes:     PREGNANCY: Vaginal deliveries 4 Tearing No C-section deliveries 2 Currently pregnant No  PROLAPSE: None   OBJECTIVE:   DIAGNOSTIC FINDINGS:    PATIENT SURVEYS:    PFIQ-7   COGNITION: Overall cognitive status: Within functional limits for tasks assessed     SENSATION: Light touch:  Proprioception:   MUSCLE LENGTH: Hamstrings:  Thomas test:   LUMBAR SPECIAL TESTS:    FUNCTIONAL TESTS:    GAIT:  Comments: WFL   POSTURE: increased lumbar lordosis, anterior pelvic tilt, and left pelvic obliquity  PELVIC ALIGNMENT: Lt ilium anterior rotation  LUMBARAROM/PROM:  A/PROM A/PROM  eval  Flexion WFL   Extension   Right lateral flexion   Left lateral flexion   Right rotation   Left rotation    (Blank rows = not  tested)  LOWER EXTREMITY ROM:   ROM Right eval Left eval  Hip flexion    Hip extension    Hip abduction    Hip adduction    Hip internal rotation    Hip external rotation    Knee flexion    Knee extension    Ankle dorsiflexion    Ankle plantarflexion    Ankle inversion    Ankle eversion     (Blank rows = not tested)  LOWER EXTREMITY MMT:  MMT Right eval Left eval  Hip flexion    Hip extension    Hip abduction    Hip adduction    Hip internal rotation    Hip external rotation    Knee flexion    Knee extension    Ankle dorsiflexion    Ankle plantarflexion    Ankle inversion    Ankle eversion     PALPATION:   General  tension in lumbar                External Perineal Exam normal appearance other than slightly pale color                             Internal Pelvic Floor left ileococcygeus tight and tender to palpation, Rt more tender without as much rigid tightness, more boggy feeling on the Rt  Patient confirms identification and approves PT to assess internal pelvic floor and treatment Yes  PELVIC MMT:   MMT eval  Vaginal 2/5, 5 reps (holding breath), 5 sec also holding breath  Internal Anal Sphincter   External Anal Sphincter   Puborectalis   Diastasis Recti Lower abdomen 1 finger (had hernia surgery)  (Blank rows = not tested)        TONE: high  PROLAPSE: Anterior wall  TODAY'S TREATMENT:  DATE: 05/13/22  EVAL    PATIENT EDUCATION:  Education details: discussed making sure to stretch more - will give more specifics at follow up Person educated: Patient Education method: Explanation Education comprehension: verbalized understanding and needs further education  HOME EXERCISE PROGRAM: None given today  ASSESSMENT:  CLINICAL IMPRESSION: Patient is a 37 y.o. female who was seen today for physical therapy evaluation and  treatment for stress urinary incontinence.  Limited muscle testing in LE due to time.  Pt has posture abnormalities as noted above. Pt has pelvic floor and core weakness and decreased endurance.  Pt has muscle spasms in pelvic floor and low back.  Pt has pelvic obliquity on Left side with anterior rotation and upslip of pelvis on left.  Pt has decreased pelvic floor muscle strength and coordination impairment with holding breath when attempting to kegel. Pt will benefit from skilled PT to address all above mentioned impairments in order to return to maximum function without leakage  OBJECTIVE IMPAIRMENTS: decreased coordination, decreased endurance, decreased strength, increased muscle spasms, impaired tone, and postural dysfunction.   ACTIVITY LIMITATIONS: continence and exercise and running or fast movements  PARTICIPATION LIMITATIONS: interpersonal relationship and community activity  PERSONAL FACTORS: Time since onset of injury/illness/exacerbation and 3+ comorbidities: PMH: bowel obstruction; c-section x 2, ovarian cyst, ventral hernia repair  are also affecting patient's functional outcome.   REHAB POTENTIAL: Excellent  CLINICAL DECISION MAKING: Evolving/moderate complexity  EVALUATION COMPLEXITY: Moderate   GOALS: Goals reviewed with patient? Yes  SHORT TERM GOALS: Target date: 06/10/22  Able to do kegel without holding breath Baseline: Goal status: INITIAL  2.  Pt will be ind with initial HEP Baseline:  Goal status: INITIAL     LONG TERM GOALS: Target date: 08/05/22  Pt will be able to get picked up or do quick movements like jumping without leakage Baseline:  Goal status: INITIAL  2.  Pt will be independent with advanced HEP to maintain improvements made throughout therapy  Baseline:  Goal status: INITIAL  3.  Pt will demonstrate kegel for at lest 15 sec without holding breath for endurance related exercises Baseline:  Goal status: INITIAL  4.  Pt will be able to  jog slowly for 5 min without leakage in order to work towards faster runs Baseline:  Goal status: INITIAL  5.  Pt will report pain during intercourse down to 5/10. Baseline: 10/10 Goal status: INITIAL    PLAN:  PT FREQUENCY: 1x/week  PT DURATION: 12 weeks  PLANNED INTERVENTIONS: Therapeutic exercises, Therapeutic activity, Neuromuscular re-education, Balance training, Gait training, Patient/Family education, Self Care, Joint mobilization, Dry Needling, Electrical stimulation, Cryotherapy, Moist heat, scar mobilization, Taping, Biofeedback, Manual therapy, and Re-evaluation  PLAN FOR NEXT SESSION: lumbar and hip stretches and breathing/bulging pelvic floor, internal STM to work on breathing and bulging and getting better circular contraction with exhale, issue HEP   Jakki L Aija Scarfo, PT 06/19/2022, 7:56 AM

## 2022-06-26 ENCOUNTER — Ambulatory Visit: Payer: Commercial Managed Care - PPO | Attending: Obstetrics and Gynecology | Admitting: Physical Therapy

## 2022-06-26 DIAGNOSIS — R279 Unspecified lack of coordination: Secondary | ICD-10-CM | POA: Diagnosis present

## 2022-06-26 DIAGNOSIS — M6281 Muscle weakness (generalized): Secondary | ICD-10-CM | POA: Diagnosis present

## 2022-06-26 DIAGNOSIS — R293 Abnormal posture: Secondary | ICD-10-CM | POA: Diagnosis present

## 2022-06-26 NOTE — Therapy (Addendum)
r OUTPATIENT PHYSICAL THERAPY FEMALE PELVIC TREATMENT   Patient Name: Beth Cordova MRN: [id number] DOB:1985-06-10, 37 y.o., female Today's Date: 06/27/2022  END OF SESSION:  PT End of Session - 06/27/22 0809     Visit Number 2    Date for PT Re-Evaluation 08/05/22    Authorization Type Medicaid UHC community    PT Start Time 1017    PT Stop Time 1057    PT Time Calculation (min) 40 min    Activity Tolerance Patient tolerated treatment well    Behavior During Therapy WFL for tasks assessed/performed              Past Medical History:  Diagnosis Date   Bowel dysfunction    States she has to eat 5 small meals a day   Bowel obstruction (HCC)    Hep C w/o coma, chronic ([place])    History of preterm delivery 12/23/2016   20 week delivery at home PPROM then delivery.    Ovarian cyst    Trichomonas infection    UTI (urinary tract infection)    Past Surgical History:  Procedure Laterality Date   CESAREAN SECTION     CESAREAN SECTION N/A 07/01/2020   Procedure: CESAREAN SECTION;  Surgeon: Osborne Oman, MD;  Location: [facility];  Service: Obstetrics;  Laterality: N/A;   LAPAROSCOPY N/A 11/20/2020   Procedure: LAPAROSCOPY DIAGNOSTIC;  Surgeon: Rolm Bookbinder, MD;  Location: [facility];  Service: General;  Laterality: N/A;   NO PAST SURGERIES     VENTRAL HERNIA REPAIR N/A 11/20/2020   Procedure: HERNIA REPAIR VENTRAL ADULT POSSIBLE MESH;  Surgeon: Rolm Bookbinder, MD;  Location: [facility];  Service: General;  Laterality: N/A;   [place] EXTRACTION     Patient Active Problem List   Diagnosis Date Noted   Incarcerated hernia 11/19/2020   Hepatitis C antibody positive in blood 04/05/2020   Chronic hepatitis C virus infection ([place]) 10/20/2018    PCP: None  REFERRING PROVIDER: Inez Catalina, MD   REFERRING DIAG: N39.3 (ICD-10-CM) - SUI (stress urinary incontinence, female)   THERAPY DIAG:  Muscle weakness (generalized)  Unspecified lack of  coordination  Abnormal posture  Rationale for Evaluation and Treatment: Rehabilitation  ONSET DATE: 2 years ago  SUBJECTIVE:                                                                                                                                                                                           SUBJECTIVE STATEMENT: Pt has felt better but still hard to do a lot of activity like jumping off the porch or trampolines.  I have to  go change my clothes and it is inconvenient. Fluid intake:    PAIN:  Are you having pain? No  PRECAUTIONS: None  WEIGHT BEARING RESTRICTIONS: No  FALLS:  Has patient fallen in last 6 months? No  LIVING ENVIRONMENT: Lives with: lives with their family Lives in: House/apartment   OCCUPATION:   PLOF: Independent  PATIENT GOALS: get picked up, run, jumping  PERTINENT HISTORY:  PMH: bowel obstruction; c-section x 2, ovarian cyst, ventral hernia repair Sexual abuse: No  BOWEL MOVEMENT: Pain with bowel movement: No Type of bowel movement:Type (Bristol Stool Scale) sometimes constipation, Frequency 1/day at least, and Strain Yes sometimes Fully empty rectum: Yes: most of the time Leakage:  Pads:  Fiber supplement:   URINATION: Pain with urination: No Fully empty bladder: Yes:   Stream: Strong Urgency: Yes: sometimes Frequency: normal 1/night sometimes Leakage:  running and jumping Pads: Yes: only when going outside to run - 1 pad  INTERCOURSE: Pain with intercourse:  one spot that can hurt depending on the position Ability to have vaginal penetration:  Yes:     PREGNANCY: Vaginal deliveries 4 Tearing No C-section deliveries 2 Currently pregnant No  PROLAPSE: None   OBJECTIVE:   DIAGNOSTIC FINDINGS:    PATIENT SURVEYS:    PFIQ-7   COGNITION: Overall cognitive status: Within functional limits for tasks assessed     SENSATION: Light touch:  Proprioception:   MUSCLE LENGTH: Hamstrings:  Thomas test:    LUMBAR SPECIAL TESTS:    FUNCTIONAL TESTS:    GAIT:  Comments: WFL   POSTURE: increased lumbar lordosis, anterior pelvic tilt, and left pelvic obliquity  PELVIC ALIGNMENT: Lt ilium anterior rotation  LUMBARAROM/PROM:  A/PROM A/PROM  eval  Flexion WFL   Extension   Right lateral flexion   Left lateral flexion   Right rotation   Left rotation    (Blank rows = not tested)  LOWER EXTREMITY ROM:   ROM Right eval Left eval  Hip flexion    Hip extension    Hip abduction    Hip adduction    Hip internal rotation    Hip external rotation    Knee flexion    Knee extension    Ankle dorsiflexion    Ankle plantarflexion    Ankle inversion    Ankle eversion     (Blank rows = not tested)  LOWER EXTREMITY MMT:  MMT Right eval Left eval  Hip flexion    Hip extension    Hip abduction    Hip adduction    Hip internal rotation    Hip external rotation    Knee flexion    Knee extension    Ankle dorsiflexion    Ankle plantarflexion    Ankle inversion    Ankle eversion     PALPATION:   General  tension in lumbar                External Perineal Exam normal appearance other than slightly pale color                             Internal Pelvic Floor left ileococcygeus tight and tender to palpation, Rt more tender without as much rigid tightness, more boggy feeling on the Rt  Patient confirms identification and approves PT to assess internal pelvic floor and treatment Yes  PELVIC MMT:   MMT eval  Vaginal 2/5, 5 reps (holding breath), 5 sec also holding breath  Internal Anal  Sphincter   External Anal Sphincter   Puborectalis   Diastasis Recti Lower abdomen 1 finger (had hernia surgery)  (Blank rows = not tested)        TONE: high  PROLAPSE: Anterior wall  TODAY'S TREATMENT:                                                                                                                              DATE: 06/26/22  Manual:  Lumbar  Patient confirms  identification and approves physical therapist to perform internal soft tissue work  - coccygeus left; levators rt Theract: Moisturizers and self stretch Breathing and bulging pelvic floor   PATIENT EDUCATION:  Education details: discussed making sure to stretch more - will give more specifics at follow up Person educated: Patient Education method: Explanation Education comprehension: verbalized understanding and needs further education  HOME EXERCISE PROGRAM: None given today  ASSESSMENT:  CLINICAL IMPRESSION: Patient had tension in pelvic floor and moderately tender more so on left side today.  Pt was educated in breathing and bulging and using correct moisturizers for relaxing the pelvic floor.  Pt had some release with soft tissue massage today.  Pt will benefit from skilled PT to continue to address impairments   OBJECTIVE IMPAIRMENTS: decreased coordination, decreased endurance, decreased strength, increased muscle spasms, impaired tone, and postural dysfunction.   ACTIVITY LIMITATIONS: continence and exercise and running or fast movements  PARTICIPATION LIMITATIONS: interpersonal relationship and community activity  PERSONAL FACTORS: Time since onset of injury/illness/exacerbation and 3+ comorbidities: PMH: bowel obstruction; c-section x 2, ovarian cyst, ventral hernia repair  are also affecting patient's functional outcome.   REHAB POTENTIAL: Excellent  CLINICAL DECISION MAKING: Evolving/moderate complexity  EVALUATION COMPLEXITY: Moderate   GOALS: Goals reviewed with patient? Yes  SHORT TERM GOALS: Target date: 06/10/22  Able to do kegel without holding breath Baseline: Goal status: INITIAL  2.  Pt will be ind with initial HEP Baseline:  Goal status: INITIAL     LONG TERM GOALS: Target date: 08/05/22  Pt will be able to get picked up or do quick movements like jumping without leakage Baseline:  Goal status: INITIAL  2.  Pt will be independent with  advanced HEP to maintain improvements made throughout therapy  Baseline:  Goal status: INITIAL  3.  Pt will demonstrate kegel for at lest 15 sec without holding breath for endurance related exercises Baseline:  Goal status: INITIAL  4.  Pt will be able to jog slowly for 5 min without leakage in order to work towards faster runs Baseline:  Goal status: INITIAL  5.  Pt will report pain during intercourse down to 5/10. Baseline: 10/10 Goal status: INITIAL    PLAN:  PT FREQUENCY: 1x/week  PT DURATION: 12 weeks  PLANNED INTERVENTIONS: Therapeutic exercises, Therapeutic activity, Neuromuscular re-education, Balance training, Gait training, Patient/Family education, Self Care, Joint mobilization, Dry Needling, Electrical stimulation, Cryotherapy, Moist heat, scar mobilization, Taping, Biofeedback, Manual therapy, and Re-evaluation  PLAN FOR NEXT SESSION: lumbar and hip stretches and breathing/bulging pelvic floor, internal STM to work on breathing and bulging and getting better circular contraction with exhale, issue HEP   [employer], PT 06/27/2022, 8:09 AM  PHYSICAL THERAPY DISCHARGE SUMMARY  Visits from Start of Care: 2  Current functional level related to goals / functional outcomes: See above   Remaining deficits: See above   Education / Equipment: HEP   Patient agrees to discharge. Patient goals were not met. Patient is being discharged due to not returning since the last visit.  Russella Dar, PT, DPT 07/24/22 12:11 PM

## 2022-06-26 NOTE — Patient Instructions (Signed)
STRETCHING THE PELVIC FLOOR MUSCLES NO DILATOR  Supplies . Vaginal lubricant . Mirror (optional) . Gloves (optional) or clean hands Positioning . Start in a semi-reclined position with your head propped up. Bend your knees and place your thumb or finger at the vaginal opening. Procedure . Apply a moderate amount of lubricant on the outer skin of your vagina, the labia minora.  Apply additional lubricant to your finger. . Spread the skin away from the vaginal opening. Place the end of your finger at the opening. . Do a maximum contraction of the pelvic floor muscles. Tighten the vagina and the anus maximally and relax. . When you know they are relaxed, gently and slowly insert your finger into your vagina, directing your finger slightly downward, for 2-3 inches of insertion. . Relax and stretch the 6 o'clock position . Hold each stretch for _30-60 seconds, no pain more than 3/10 . Repeat the stretching in the 4 o'clock and 8 o'clock positions. . Next gently move your finger in a "U" shape  several times.  . You can also enter a second finger to work to spread the vaginal opening wider from 3:00-6:00 and 6:00-9:00 or 3:00-9:00 . Perform daily or every other day . Once you have accomplished the techniques you may try them in standing with one foot resting on the tub, or in other positions.  This is a good stretch to do in the shower if you don't need to use lubricant.  This can be done at 35 weeks or later in your pregnancy.   

## 2022-06-27 ENCOUNTER — Encounter: Payer: Self-pay | Admitting: Physical Therapy

## 2022-07-02 ENCOUNTER — Ambulatory Visit: Payer: Medicaid Other | Admitting: Obstetrics and Gynecology

## 2022-07-03 ENCOUNTER — Encounter: Payer: Medicaid Other | Admitting: Physical Therapy

## 2022-07-09 NOTE — Therapy (Deleted)
r OUTPATIENT PHYSICAL THERAPY FEMALE PELVIC TREATMENT   Patient Name: Beth Cordova MRN: [id number] DOB:Dec 22, 1985, 37 y.o., female Today's Date: 07/09/2022  END OF SESSION:     Past Medical History:  Diagnosis Date   Bowel dysfunction    States she has to eat 5 small meals a day   Bowel obstruction (HCC)    Hep C w/o coma, chronic ([place])    History of preterm delivery 12/23/2016   20 week delivery at home PPROM then delivery.    Ovarian cyst    Trichomonas infection    UTI (urinary tract infection)    Past Surgical History:  Procedure Laterality Date   CESAREAN SECTION     CESAREAN SECTION N/A 07/01/2020   Procedure: CESAREAN SECTION;  Surgeon: Osborne Oman, MD;  [facility]: [facility];  Service: Obstetrics;  Laterality: N/A;   LAPAROSCOPY N/A 11/20/2020   Procedure: LAPAROSCOPY DIAGNOSTIC;  Surgeon: Rolm Bookbinder, MD;  Location: [facility];  Service: General;  Laterality: N/A;   NO PAST SURGERIES     VENTRAL HERNIA REPAIR N/A 11/20/2020   Procedure: HERNIA REPAIR VENTRAL ADULT POSSIBLE MESH;  Surgeon: Rolm Bookbinder, MD;  Location: [facility];  Service: General;  Laterality: N/A;   [place] EXTRACTION     Patient Active Problem List   Diagnosis Date Noted   Incarcerated hernia 11/19/2020   Hepatitis C antibody positive in blood 04/05/2020   Chronic hepatitis C virus infection ([place]) 10/20/2018    PCP: None  REFERRING PROVIDER: Inez Catalina, MD   REFERRING DIAG: N39.3 (ICD-10-CM) - SUI (stress urinary incontinence, female)   THERAPY DIAG:  No diagnosis found.  Rationale for Evaluation and Treatment: Rehabilitation  ONSET DATE: 2 years ago  SUBJECTIVE:                                                                                                                                                                                           SUBJECTIVE STATEMENT: Pt has felt better but still hard to do a lot of activity like jumping off the porch or  trampolines.  I have to go change my clothes and it is inconvenient. Fluid intake:    PAIN:  Are you having pain? No  PRECAUTIONS: None  WEIGHT BEARING RESTRICTIONS: No  FALLS:  Has patient fallen in last 6 months? No  LIVING ENVIRONMENT: Lives with: lives with their family Lives in: House/apartment   OCCUPATION:   PLOF: Independent  PATIENT GOALS: get picked up, run, jumping  PERTINENT HISTORY:  PMH: bowel obstruction; c-section x 2, ovarian cyst, ventral hernia repair Sexual abuse: No  BOWEL MOVEMENT: Pain with bowel  movement: No Type of bowel movement:Type (Bristol Stool Scale) sometimes constipation, Frequency 1/day at least, and Strain Yes sometimes Fully empty rectum: Yes: most of the time Leakage:  Pads:  Fiber supplement:   URINATION: Pain with urination: No Fully empty bladder: Yes:   Stream: Strong Urgency: Yes: sometimes Frequency: normal 1/night sometimes Leakage:  running and jumping Pads: Yes: only when going outside to run - 1 pad  INTERCOURSE: Pain with intercourse:  one spot that can hurt depending on the position Ability to have vaginal penetration:  Yes:     PREGNANCY: Vaginal deliveries 4 Tearing No C-section deliveries 2 Currently pregnant No  PROLAPSE: None   OBJECTIVE:   DIAGNOSTIC FINDINGS:    PATIENT SURVEYS:    PFIQ-7   COGNITION: Overall cognitive status: Within functional limits for tasks assessed     SENSATION: Light touch:  Proprioception:   MUSCLE LENGTH: Hamstrings:  Thomas test:   LUMBAR SPECIAL TESTS:    FUNCTIONAL TESTS:    GAIT:  Comments: WFL   POSTURE: increased lumbar lordosis, anterior pelvic tilt, and left pelvic obliquity  PELVIC ALIGNMENT: Lt ilium anterior rotation  LUMBARAROM/PROM:  A/PROM A/PROM  eval  Flexion WFL   Extension   Right lateral flexion   Left lateral flexion   Right rotation   Left rotation    (Blank rows = not tested)  LOWER EXTREMITY ROM:   ROM  Right eval Left eval  Hip flexion    Hip extension    Hip abduction    Hip adduction    Hip internal rotation    Hip external rotation    Knee flexion    Knee extension    Ankle dorsiflexion    Ankle plantarflexion    Ankle inversion    Ankle eversion     (Blank rows = not tested)  LOWER EXTREMITY MMT:  MMT Right eval Left eval  Hip flexion    Hip extension    Hip abduction    Hip adduction    Hip internal rotation    Hip external rotation    Knee flexion    Knee extension    Ankle dorsiflexion    Ankle plantarflexion    Ankle inversion    Ankle eversion     PALPATION:   General  tension in lumbar                External Perineal Exam normal appearance other than slightly pale color                             Internal Pelvic Floor left ileococcygeus tight and tender to palpation, Rt more tender without as much rigid tightness, more boggy feeling on the Rt  Patient confirms identification and approves PT to assess internal pelvic floor and treatment Yes  PELVIC MMT:   MMT eval  Vaginal 2/5, 5 reps (holding breath), 5 sec also holding breath  Internal Anal Sphincter   External Anal Sphincter   Puborectalis   Diastasis Recti Lower abdomen 1 finger (had hernia surgery)  (Blank rows = not tested)        TONE: high  PROLAPSE: Anterior wall  TODAY'S TREATMENT:  DATE: 06/26/22  Manual:  Lumbar  Patient confirms identification and approves physical therapist to perform internal soft tissue work  - coccygeus left; levators rt Theract: Moisturizers and self stretch Breathing and bulging pelvic floor   PATIENT EDUCATION:  Education details: discussed making sure to stretch more - will give more specifics at follow up Person educated: Patient Education method: Explanation Education comprehension: verbalized understanding and needs  further education  HOME EXERCISE PROGRAM: None given today  ASSESSMENT:  CLINICAL IMPRESSION: Patient had tension in pelvic floor and moderately tender more so on left side today.  Pt was educated in breathing and bulging and using correct moisturizers for relaxing the pelvic floor.  Pt had some release with soft tissue massage today.  Pt will benefit from skilled PT to continue to address impairments   OBJECTIVE IMPAIRMENTS: decreased coordination, decreased endurance, decreased strength, increased muscle spasms, impaired tone, and postural dysfunction.   ACTIVITY LIMITATIONS: continence and exercise and running or fast movements  PARTICIPATION LIMITATIONS: interpersonal relationship and community activity  PERSONAL FACTORS: Time since onset of injury/illness/exacerbation and 3+ comorbidities: PMH: bowel obstruction; c-section x 2, ovarian cyst, ventral hernia repair  are also affecting patient's functional outcome.   REHAB POTENTIAL: Excellent  CLINICAL DECISION MAKING: Evolving/moderate complexity  EVALUATION COMPLEXITY: Moderate   GOALS: Goals reviewed with patient? Yes  SHORT TERM GOALS: Target date: 06/10/22  Able to do kegel without holding breath Baseline: Goal status: INITIAL  2.  Pt will be ind with initial HEP Baseline:  Goal status: INITIAL     LONG TERM GOALS: Target date: 08/05/22  Pt will be able to get picked up or do quick movements like jumping without leakage Baseline:  Goal status: INITIAL  2.  Pt will be independent with advanced HEP to maintain improvements made throughout therapy  Baseline:  Goal status: INITIAL  3.  Pt will demonstrate kegel for at lest 15 sec without holding breath for endurance related exercises Baseline:  Goal status: INITIAL  4.  Pt will be able to jog slowly for 5 min without leakage in order to work towards faster runs Baseline:  Goal status: INITIAL  5.  Pt will report pain during intercourse down to  5/10. Baseline: 10/10 Goal status: INITIAL    PLAN:  PT FREQUENCY: 1x/week  PT DURATION: 12 weeks  PLANNED INTERVENTIONS: Therapeutic exercises, Therapeutic activity, Neuromuscular re-education, Balance training, Gait training, Patient/Family education, Self Care, Joint mobilization, Dry Needling, Electrical stimulation, Cryotherapy, Moist heat, scar mobilization, Taping, Biofeedback, Manual therapy, and Re-evaluation  PLAN FOR NEXT SESSION: lumbar and hip stretches and breathing/bulging pelvic floor, internal STM to work on breathing and bulging and getting better circular contraction with exhale, issue HEP   Camillo Flaming Cira Deyoe, PT 07/09/2022, 3:34 PM

## 2022-07-10 ENCOUNTER — Ambulatory Visit: Payer: Commercial Managed Care - PPO | Admitting: Physical Therapy

## 2022-07-10 ENCOUNTER — Telehealth: Payer: Self-pay | Admitting: Physical Therapy

## 2022-07-10 NOTE — Telephone Encounter (Signed)
Pt was called due to no show.  Pt had just had dental surgery and forgot to call to cancel.  Pt at this time was confirmed for next week.  Pt was reminded of no show policy.  Gustavus Bryant, PT, DPT 07/10/22 11:25 AM

## 2022-07-17 ENCOUNTER — Ambulatory Visit: Payer: Medicaid Other | Admitting: Physical Therapy

## 2022-07-17 NOTE — Therapy (Deleted)
r OUTPATIENT PHYSICAL THERAPY FEMALE PELVIC TREATMENT   Patient Name: Beth Cordova MRN: [id number] DOB:14-Mar-1986, 37 y.o., female Today's Date: 07/17/2022  END OF SESSION:     Past Medical History:  Diagnosis Date   Bowel dysfunction    States she has to eat 5 small meals a day   Bowel obstruction (HCC)    Hep C w/o coma, chronic ([facility])    History of preterm delivery 12/23/2016   20 week delivery at home PPROM then delivery.    Ovarian cyst    Trichomonas infection    UTI (urinary tract infection)    Past Surgical History:  Procedure Laterality Date   CESAREAN SECTION     CESAREAN SECTION N/A 07/01/2020   Procedure: CESAREAN SECTION;  Surgeon: Osborne Oman, MD;  Location: [facility];  Service: Obstetrics;  Laterality: N/A;   LAPAROSCOPY N/A 11/20/2020   Procedure: LAPAROSCOPY DIAGNOSTIC;  Surgeon: Rolm Bookbinder, MD;  Location: [place];  Service: General;  Laterality: N/A;   NO PAST SURGERIES     VENTRAL HERNIA REPAIR N/A 11/20/2020   Procedure: HERNIA REPAIR VENTRAL ADULT POSSIBLE MESH;  Surgeon: Rolm Bookbinder, MD;  Location: [place];  Service: General;  Laterality: N/A;   [place] EXTRACTION     Patient Active Problem List   Diagnosis Date Noted   Incarcerated hernia 11/19/2020   Hepatitis C antibody positive in blood 04/05/2020   Chronic hepatitis C virus infection 10/20/2018    PCP: None  REFERRING PROVIDER: Inez Catalina, MD   REFERRING DIAG: N39.3 (ICD-10-CM) - SUI (stress urinary incontinence, female)   THERAPY DIAG:  No diagnosis found.  Rationale for Evaluation and Treatment: Rehabilitation  ONSET DATE: 2 years ago  SUBJECTIVE:                                                                                                                                                                                           SUBJECTIVE STATEMENT: Pt has felt better but still hard to do a lot of activity like jumping off the porch or trampolines.  I  have to go change my clothes and it is inconvenient. Fluid intake:    PAIN:  Are you having pain? No  PRECAUTIONS: None  WEIGHT BEARING RESTRICTIONS: No  FALLS:  Has patient fallen in last 6 months? No  LIVING ENVIRONMENT: Lives with: lives with their family Lives in: House/apartment   OCCUPATION:   PLOF: Independent  PATIENT GOALS: get picked up, run, jumping  PERTINENT HISTORY:  PMH: bowel obstruction; c-section x 2, ovarian cyst, ventral hernia repair Sexual abuse: No  BOWEL MOVEMENT: Pain with bowel movement:  No Type of bowel movement:Type (Bristol Stool Scale) sometimes constipation, Frequency 1/day at least, and Strain Yes sometimes Fully empty rectum: Yes: most of the time Leakage:  Pads:  Fiber supplement:   URINATION: Pain with urination: No Fully empty bladder: Yes:   Stream: Strong Urgency: Yes: sometimes Frequency: normal 1/night sometimes Leakage:  running and jumping Pads: Yes: only when going outside to run - 1 pad  INTERCOURSE: Pain with intercourse:  one spot that can hurt depending on the position Ability to have vaginal penetration:  Yes:     PREGNANCY: Vaginal deliveries 4 Tearing No C-section deliveries 2 Currently pregnant No  PROLAPSE: None   OBJECTIVE:   DIAGNOSTIC FINDINGS:    PATIENT SURVEYS:    PFIQ-7   COGNITION: Overall cognitive status: Within functional limits for tasks assessed     SENSATION: Light touch:  Proprioception:   MUSCLE LENGTH: Hamstrings:  Thomas test:   LUMBAR SPECIAL TESTS:    FUNCTIONAL TESTS:    GAIT:  Comments: WFL   POSTURE: increased lumbar lordosis, anterior pelvic tilt, and left pelvic obliquity  PELVIC ALIGNMENT: Lt ilium anterior rotation  LUMBARAROM/PROM:  A/PROM A/PROM  eval  Flexion WFL   Extension   Right lateral flexion   Left lateral flexion   Right rotation   Left rotation    (Blank rows = not tested)  LOWER EXTREMITY ROM:   ROM Right eval  Left eval  Hip flexion    Hip extension    Hip abduction    Hip adduction    Hip internal rotation    Hip external rotation    Knee flexion    Knee extension    Ankle dorsiflexion    Ankle plantarflexion    Ankle inversion    Ankle eversion     (Blank rows = not tested)  LOWER EXTREMITY MMT:  MMT Right eval Left eval  Hip flexion    Hip extension    Hip abduction    Hip adduction    Hip internal rotation    Hip external rotation    Knee flexion    Knee extension    Ankle dorsiflexion    Ankle plantarflexion    Ankle inversion    Ankle eversion     PALPATION:   General  tension in lumbar                External Perineal Exam normal appearance other than slightly pale color                             Internal Pelvic Floor left ileococcygeus tight and tender to palpation, Rt more tender without as much rigid tightness, more boggy feeling on the Rt  Patient confirms identification and approves PT to assess internal pelvic floor and treatment Yes  PELVIC MMT:   MMT eval  Vaginal 2/5, 5 reps (holding breath), 5 sec also holding breath  Internal Anal Sphincter   External Anal Sphincter   Puborectalis   Diastasis Recti Lower abdomen 1 finger (had hernia surgery)  (Blank rows = not tested)        TONE: high  PROLAPSE: Anterior wall  TODAY'S TREATMENT:  DATE: 06/26/22  Manual:  Lumbar  Patient confirms identification and approves physical therapist to perform internal soft tissue work  - coccygeus left; levators rt Theract: Moisturizers and self stretch Breathing and bulging pelvic floor   PATIENT EDUCATION:  Education details: discussed making sure to stretch more - will give more specifics at follow up Person educated: Patient Education method: Explanation Education comprehension: verbalized understanding and needs further  education  HOME EXERCISE PROGRAM: None given today  ASSESSMENT:  CLINICAL IMPRESSION: Patient had tension in pelvic floor and moderately tender more so on left side today.  Pt was educated in breathing and bulging and using correct moisturizers for relaxing the pelvic floor.  Pt had some release with soft tissue massage today.  Pt will benefit from skilled PT to continue to address impairments   OBJECTIVE IMPAIRMENTS: decreased coordination, decreased endurance, decreased strength, increased muscle spasms, impaired tone, and postural dysfunction.   ACTIVITY LIMITATIONS: continence and exercise and running or fast movements  PARTICIPATION LIMITATIONS: interpersonal relationship and community activity  PERSONAL FACTORS: Time since onset of injury/illness/exacerbation and 3+ comorbidities: PMH: bowel obstruction; c-section x 2, ovarian cyst, ventral hernia repair  are also affecting patient's functional outcome.   REHAB POTENTIAL: Excellent  CLINICAL DECISION MAKING: Evolving/moderate complexity  EVALUATION COMPLEXITY: Moderate   GOALS: Goals reviewed with patient? Yes  SHORT TERM GOALS: Target date: 06/10/22  Able to do kegel without holding breath Baseline: Goal status: INITIAL  2.  Pt will be ind with initial HEP Baseline:  Goal status: INITIAL     LONG TERM GOALS: Target date: 08/05/22  Pt will be able to get picked up or do quick movements like jumping without leakage Baseline:  Goal status: INITIAL  2.  Pt will be independent with advanced HEP to maintain improvements made throughout therapy  Baseline:  Goal status: INITIAL  3.  Pt will demonstrate kegel for at lest 15 sec without holding breath for endurance related exercises Baseline:  Goal status: INITIAL  4.  Pt will be able to jog slowly for 5 min without leakage in order to work towards faster runs Baseline:  Goal status: INITIAL  5.  Pt will report pain during intercourse down to 5/10. Baseline:  10/10 Goal status: INITIAL    PLAN:  PT FREQUENCY: 1x/week  PT DURATION: 12 weeks  PLANNED INTERVENTIONS: Therapeutic exercises, Therapeutic activity, Neuromuscular re-education, Balance training, Gait training, Patient/Family education, Self Care, Joint mobilization, Dry Needling, Electrical stimulation, Cryotherapy, Moist heat, scar mobilization, Taping, Biofeedback, Manual therapy, and Re-evaluation  PLAN FOR NEXT SESSION: lumbar and hip stretches and breathing/bulging pelvic floor, internal STM to work on breathing and bulging and getting better circular contraction with exhale, issue HEP   Jakki L Braylee Bosher, PT 07/17/2022, 7:59 AM

## 2022-07-24 ENCOUNTER — Ambulatory Visit: Payer: Medicaid Other | Attending: Obstetrics and Gynecology | Admitting: Physical Therapy

## 2022-07-24 DIAGNOSIS — R293 Abnormal posture: Secondary | ICD-10-CM | POA: Insufficient documentation

## 2022-07-24 DIAGNOSIS — M6281 Muscle weakness (generalized): Secondary | ICD-10-CM | POA: Insufficient documentation

## 2022-07-24 DIAGNOSIS — R279 Unspecified lack of coordination: Secondary | ICD-10-CM | POA: Insufficient documentation

## 2022-08-06 ENCOUNTER — Ambulatory Visit: Payer: Medicaid Other | Admitting: Obstetrics and Gynecology

## 2022-08-12 IMAGING — DX DG ABDOMEN 1V
1 series · 1 of 1 positions shown · non-contrast
Comparison: None Available.

CLINICAL DATA: Lower abdominal pain with associated nausea,
vomiting, diarrhea and constipation.

EXAM:
ABDOMEN - 1 VIEW

[abdomen kub]
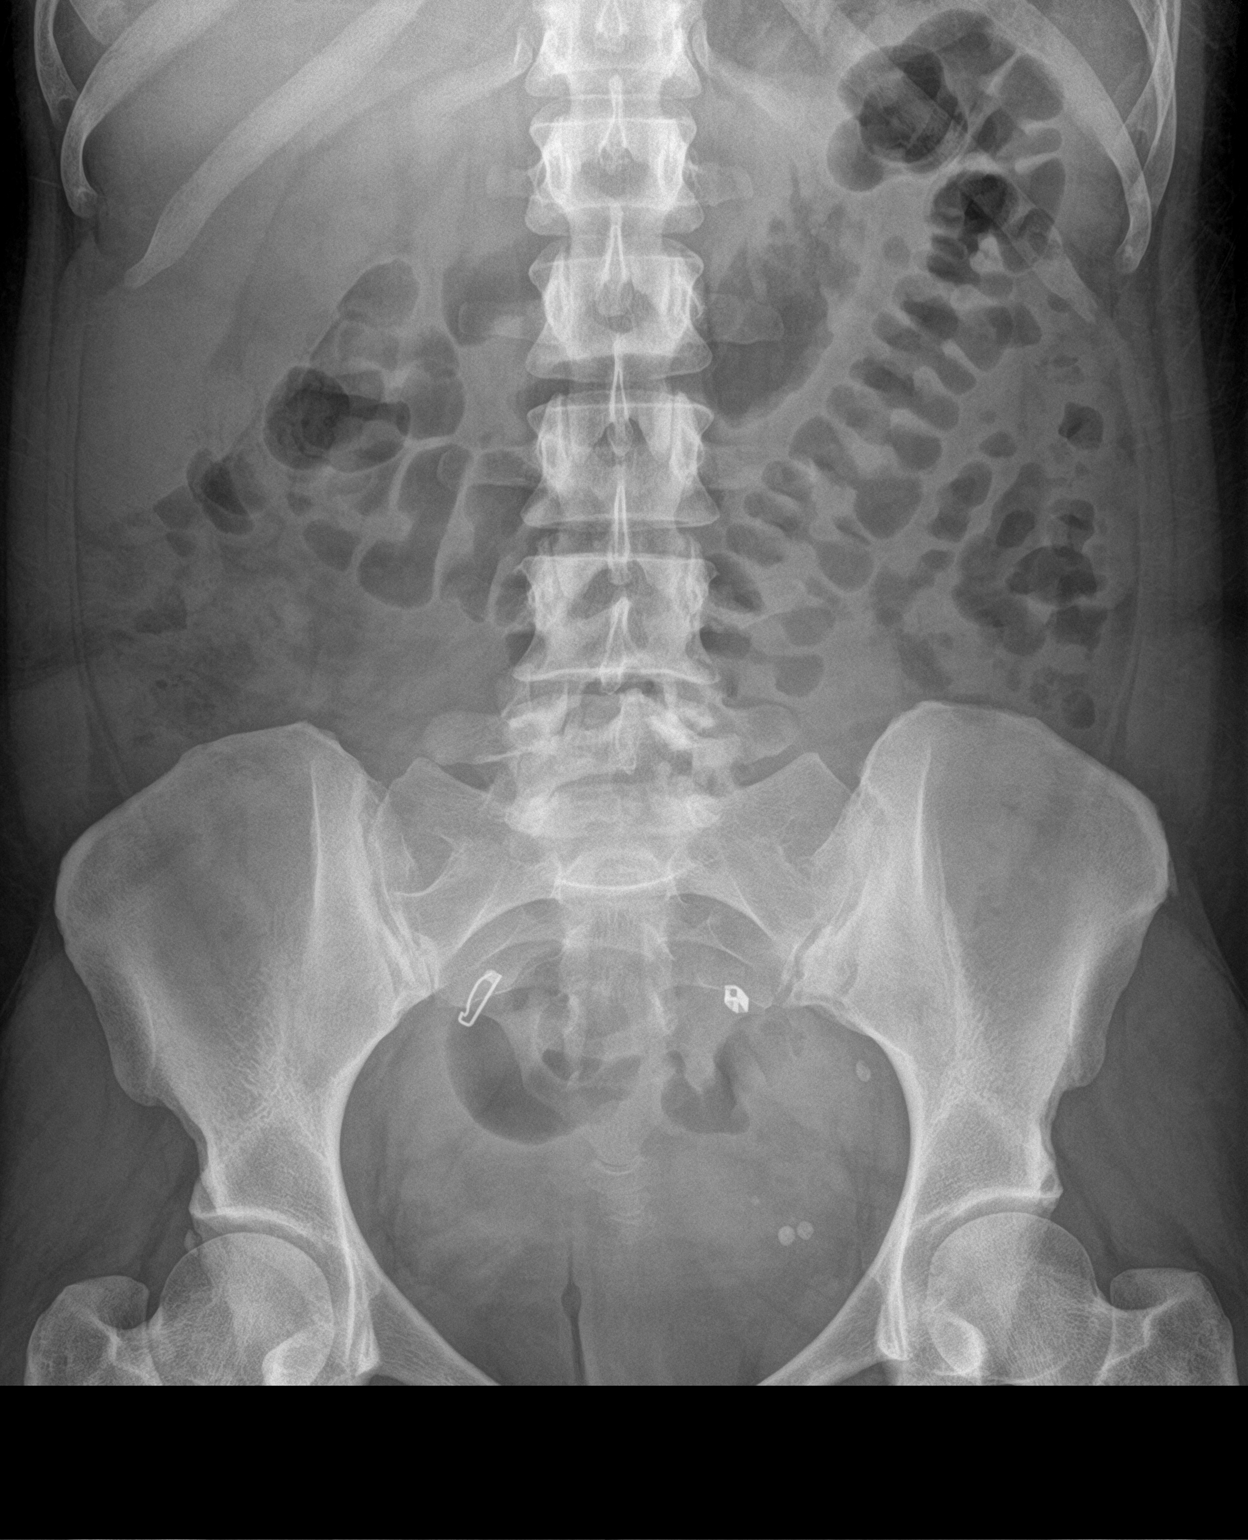

[1 of 1 positions shown; findings below may reference images not displayed]

FINDINGS: Bowel gas pattern is nonobstructive. No free peritoneal air. Several
pelvic phleboliths are present. Evidence of previous bilateral tubal
ligation. Bony structures are normal.
IMPRESSION: Nonobstructive bowel gas pattern.

## 2022-08-21 ENCOUNTER — Ambulatory Visit: Payer: Medicaid Other | Admitting: Obstetrics and Gynecology
